# Patient Record
Sex: Male | Born: 1990 | Race: Black or African American | Hispanic: No | Marital: Single | Smoking: Never smoker
Health system: Southern US, Community
[De-identification: ages and names within clinical notes are randomized; demographics above are authoritative.]

## PROBLEM LIST (undated history)

## (undated) DIAGNOSIS — E119 Type 2 diabetes mellitus without complications: Secondary | ICD-10-CM

## (undated) HISTORY — PX: CIRCUMCISION: SUR203

## (undated) HISTORY — PX: TONSILLECTOMY: SUR1361

---

## 2009-07-13 ENCOUNTER — Emergency Department (HOSPITAL_BASED_OUTPATIENT_CLINIC_OR_DEPARTMENT_OTHER): Admission: EM | Admit: 2009-07-13 | Discharge: 2009-07-13 | Payer: Self-pay | Admitting: Emergency Medicine

## 2010-01-07 ENCOUNTER — Emergency Department (HOSPITAL_BASED_OUTPATIENT_CLINIC_OR_DEPARTMENT_OTHER): Admission: EM | Admit: 2010-01-07 | Discharge: 2010-01-07 | Payer: Self-pay | Admitting: Emergency Medicine

## 2011-05-23 ENCOUNTER — Emergency Department (HOSPITAL_BASED_OUTPATIENT_CLINIC_OR_DEPARTMENT_OTHER)
Admission: EM | Admit: 2011-05-23 | Discharge: 2011-05-23 | Disposition: A | Discharge status: Other Acute Inpatient Hospital | Payer: Self-pay | Attending: Emergency Medicine | Admitting: Emergency Medicine

## 2011-05-23 ENCOUNTER — Encounter: Payer: Self-pay | Admitting: Emergency Medicine

## 2011-05-23 DIAGNOSIS — K625 Hemorrhage of anus and rectum: Secondary | ICD-10-CM | POA: Insufficient documentation

## 2011-05-23 DIAGNOSIS — R197 Diarrhea, unspecified: Secondary | ICD-10-CM | POA: Insufficient documentation

## 2011-05-23 NOTE — ED Provider Notes (Signed)
Scribed for Andrew Chick, MD, the patient was seen in room MH11/MH11 . This chart was scribed by Ellie Lunch. This patient's care was started at 6:18 PM.   CSN: [id number] Arrival date & time: 05/23/2011  5:56 PM   First MD Initiated Contact with Patient 05/23/11 1810      Chief Complaint  Patient presents with  . Diarrhea  . Rectal Bleeding    (Consider location/radiation/quality/duration/timing/severity/associated sxs/prior treatment) HPI Andrew Richards is a 20 y.o. male who presents to the Emergency Department complaining of diarrhea for the past 3 days. Pt c/o 4-5 episodes of liquid diarrhea for the past 3 days with some associated abdominal cramping prior to episodes. Reports a small amount of bright red blood last night and this morning on toilet paper twhen he wiped. No clots present. Denies any current abdominal pain and reports only 3 episodes of diarrhea today.  Denies fever and vomiting. Denies any sick contact with stomach illness. No recent foreign travel. Says he is staying well hydrated.  Mild lower abdominal cramping resolves after diarrhea.  No fever   No past medical history on file.  Past Surgical History  Procedure Date  . Tonsillectomy     No family history on file.  History  Substance Use Topics  . Smoking status: Never Smoker   . Smokeless tobacco: Not on file  . Alcohol Use: No    Review of Systems 10 Systems reviewed and are negative for acute change except as noted in the HPI.  Allergies  Review of patient's allergies indicates no known allergies.  Home Medications   Current Outpatient Rx  Name Route Sig Dispense Refill  . ACETAMINOPHEN 325 MG PO TABS Oral Take 650 mg by mouth every 6 (six) hours as needed. For headache and pain       BP 150/74  Pulse 92  Temp(Src) 99.1 F (37.3 C) (Oral)  Resp 16  Ht 5\' 10"  (1.778 m)  Wt 380 lb 3 oz (172.452 kg)  BMI 54.55 kg/m2  SpO2 99%  Physical Exam  Nursing note and vitals  reviewed. Constitutional: He is oriented to person, place, and time. He appears well-developed and well-nourished.  HENT:  Head: Normocephalic and atraumatic.  Mouth/Throat: Oropharynx is clear and moist and mucous membranes are normal.  Eyes: Conjunctivae and EOM are normal.  Neck: Normal range of motion. Neck supple.  Cardiovascular: Normal rate, regular rhythm and normal heart sounds.   Pulmonary/Chest: Effort normal and breath sounds normal.  Abdominal: Soft. Bowel sounds are normal. There is no tenderness.  Genitourinary: Rectum normal.       No gross blood on digital rectal exam. Some extremal rectal irritation. No fissures or external hemorrhoids present.   Musculoskeletal: Normal range of motion. He exhibits no tenderness.  Neurological: He is alert and oriented to person, place, and time.  Skin: Skin is warm and dry.  Psychiatric: He has a normal mood and affect.   Procedures (including critical care time)  OTHER DATA REVIEWED: Nursing notes, vital signs, and past medical records reviewed.   DIAGNOSTIC STUDIES: Oxygen Saturation is 99% on room air, normal by my interpretation.    LABS / RADIOLOGY:  Results for orders placed during the hospital encounter of 05/23/11  OCCULT BLOOD X 1 CARD TO LAB, STOOL      Component Value Range   Fecal Occult Bld NEGATIVE     ED COURSE / COORDINATION OF CARE: 6:39 PM EDP discussed with pt blood usually present from irritation. Discussed  likely viral infection and plan to do hemoccult and stool culture.   MDM: pt with occcasional loose stools/diarrhea over the past 3 days, no fever or vomiting.  Saw blood on toilet tissue but no blood clots or gross blood in toilet.  Rectal revealed mild perirectal skin irritation, hemocult negative.  Pt concerned due to working with food.  Not able to give stool culture in the Ed, will send home to obtain specimen.  Pt nontoxic and well hydrated appearing.  Discharged with strict return precautions.  Pt  agreeable with plan.   SCRIBE ATTESTATION: I personally performed the services described in this documentation, which was scribed in my presence. The recorded information has been reviewed and considered.         Andrew Chick, MD 05/23/11 [phone]

## 2011-05-23 NOTE — ED Notes (Signed)
Diarrhea x 3 days.  No known fever.  Eating habits have changed.  Abdominal cramping.

## 2013-05-14 ENCOUNTER — Emergency Department (HOSPITAL_BASED_OUTPATIENT_CLINIC_OR_DEPARTMENT_OTHER)
Admission: EM | Admit: 2013-05-14 | Discharge: 2013-05-14 | Disposition: A | Discharge status: Home / Self Care | Payer: Self-pay | Attending: Emergency Medicine | Admitting: Emergency Medicine

## 2013-05-14 ENCOUNTER — Encounter (HOSPITAL_BASED_OUTPATIENT_CLINIC_OR_DEPARTMENT_OTHER): Payer: Self-pay | Admitting: Emergency Medicine

## 2013-05-14 DIAGNOSIS — M79671 Pain in right foot: Secondary | ICD-10-CM

## 2013-05-14 DIAGNOSIS — M25579 Pain in unspecified ankle and joints of unspecified foot: Secondary | ICD-10-CM | POA: Insufficient documentation

## 2013-05-14 DIAGNOSIS — J45909 Unspecified asthma, uncomplicated: Secondary | ICD-10-CM | POA: Insufficient documentation

## 2013-05-14 MED ORDER — IBUPROFEN 800 MG PO TABS: 800.0000 mg | ORAL_TABLET | Freq: Three times a day (TID) | ORAL | Status: DC

## 2013-05-14 NOTE — ED Provider Notes (Signed)
CSN: [id number]     Arrival date & time 05/14/13  1930 History   First MD Initiated Contact with Patient 05/14/13 1956     Chief Complaint  Patient presents with  . Ankle Pain  . Knee Pain   (Consider location/radiation/quality/duration/timing/severity/associated sxs/prior Treatment) Patient is a 22 y.o. male presenting with ankle pain and knee pain. The history is provided by the patient. No language interpreter was used.  Ankle Pain Location:  Ankle Ankle location:  R ankle Associated symptoms: no fever   Associated symptoms comment:  Worsening more persistent pain over the last several weeks to right ankle without direct injury. He works on his feet all day and reports the pain is worse throughout the day.  Knee Pain Associated symptoms: no fever     Past Medical History  Diagnosis Date  . Asthma    Past Surgical History  Procedure Laterality Date  . Tonsillectomy    . Circumcision     History reviewed. No pertinent family history. History  Substance Use Topics  . Smoking status: Never Smoker   . Smokeless tobacco: Not on file  . Alcohol Use: No    Review of Systems  Constitutional: Negative for fever and chills.  Respiratory: Negative for shortness of breath.   Musculoskeletal: Negative.        See HPI  Skin: Negative.   Neurological: Negative.  Negative for numbness.    Allergies  Shellfish allergy  Home Medications   Current Outpatient Rx  Name  Route  Sig  Dispense  Refill  . ibuprofen (ADVIL,MOTRIN) 600 MG tablet   Oral   Take 600 mg by mouth every 6 (six) hours as needed for pain.         Marland Kitchen acetaminophen (TYLENOL) 325 MG tablet   Oral   Take 650 mg by mouth every 6 (six) hours as needed. For headache and pain           BP 160/99  Pulse 79  Temp(Src) 98.5 F (36.9 C) (Oral)  Resp 20  Ht 5\' 10"  (1.778 m)  SpO2 100% Physical Exam  Constitutional: He is oriented to person, place, and time. He appears well-developed and well-nourished.   Neck: Normal range of motion.  Pulmonary/Chest: Effort normal.  Musculoskeletal: Normal range of motion.  Right ankle unremarkable in appearance, no swelling or discoloration. Joint stable. Mild to moderate tenderness to anterolateral ankle into proximal foot. FROM, full strength.   Neurological: He is alert and oriented to person, place, and time.  Skin: Skin is warm and dry.  Psychiatric: He has a normal mood and affect.    ED Course  Procedures (including critical care time) Labs Review Labs Reviewed - No data to display Imaging Review No results found.  EKG Interpretation   None       MDM  No diagnosis found. 1. Foot pain  Uncomplicated foot pain, persistent.     Arnoldo Hooker, PA-C 05/14/13 2046

## 2013-05-14 NOTE — ED Notes (Signed)
C/o right ankle pain and left knee pain for few weeks, denies injury. Pt is able to ambulate. Denies CP or SOB.

## 2013-05-14 NOTE — ED Provider Notes (Signed)
Medical screening examination/treatment/procedure(s) were performed by non-physician practitioner and as supervising physician I was immediately available for consultation/collaboration.     Christopher J. Pollina, MD 05/14/13 2236 

## 2013-12-31 ENCOUNTER — Encounter (HOSPITAL_BASED_OUTPATIENT_CLINIC_OR_DEPARTMENT_OTHER): Payer: Self-pay | Admitting: Emergency Medicine

## 2013-12-31 ENCOUNTER — Inpatient Hospital Stay (HOSPITAL_BASED_OUTPATIENT_CLINIC_OR_DEPARTMENT_OTHER)
Admission: EM | Admit: 2013-12-31 | Discharge: 2014-01-02 | DRG: 638 | Disposition: A | Discharge status: Home / Self Care | Payer: Self-pay | Attending: Internal Medicine | Admitting: Internal Medicine

## 2013-12-31 DIAGNOSIS — Z833 Family history of diabetes mellitus: Secondary | ICD-10-CM

## 2013-12-31 DIAGNOSIS — R739 Hyperglycemia, unspecified: Secondary | ICD-10-CM | POA: Diagnosis present

## 2013-12-31 DIAGNOSIS — Z6841 Body Mass Index (BMI) 40.0 and over, adult: Secondary | ICD-10-CM

## 2013-12-31 DIAGNOSIS — R030 Elevated blood-pressure reading, without diagnosis of hypertension: Secondary | ICD-10-CM | POA: Diagnosis present

## 2013-12-31 DIAGNOSIS — E1310 Other specified diabetes mellitus with ketoacidosis without coma: Principal | ICD-10-CM | POA: Diagnosis present

## 2013-12-31 DIAGNOSIS — Z8249 Family history of ischemic heart disease and other diseases of the circulatory system: Secondary | ICD-10-CM

## 2013-12-31 DIAGNOSIS — Z91013 Allergy to seafood: Secondary | ICD-10-CM

## 2013-12-31 DIAGNOSIS — R109 Unspecified abdominal pain: Secondary | ICD-10-CM | POA: Diagnosis present

## 2013-12-31 DIAGNOSIS — J45909 Unspecified asthma, uncomplicated: Secondary | ICD-10-CM | POA: Diagnosis present

## 2013-12-31 DIAGNOSIS — E119 Type 2 diabetes mellitus without complications: Secondary | ICD-10-CM | POA: Diagnosis present

## 2013-12-31 LAB — CBG MONITORING, ED: Glucose-Capillary: 464 mg/dL — ABNORMAL HIGH (ref 70–99)

## 2013-12-31 MED ORDER — SODIUM CHLORIDE 0.9 % IV BOLUS (SEPSIS)
1000.0000 mL | Freq: Once | INTRAVENOUS | Status: AC
  Administered 2014-01-01: 1000 mL via INTRAVENOUS

## 2013-12-31 MED ORDER — SODIUM CHLORIDE 0.9 % IV SOLN
INTRAVENOUS | Status: DC
  Administered 2014-01-01: 4 [IU]/h via INTRAVENOUS

## 2013-12-31 NOTE — ED Provider Notes (Signed)
CSN: [id number]     Arrival date & time 12/31/13  2302 History  This chart was scribed for Hanley Seamen, MD by Modena Jansky, ED Scribe. This patient was seen in room MH07/MH07 and the patient's care was started at 11:47 PM.  Chief Complaint  Patient presents with  . Dysuria    The history is provided by the patient. No language interpreter was used.   HPI Comments: Andrew Richards is a 23 y.o. male who presents to the Emergency Department complaining of increased urinary frequency and urgency for the past 3 days. He also reports associated left flank discomfort, dry mouth, polydipsia, and chills over the past 3 days. It is severe enough that he is having to urinate about once every 1.5 hours. There are no specific exacerbating or mitigating factors. His CBG in the ED was noted to be 464. He denies blurred vision, fever, nausea, vomiting, diarrhea, cough, chest pain or SOB. He states that he does not have a PCP.  Past Medical History  Diagnosis Date  . Asthma    Past Surgical History  Procedure Laterality Date  . Tonsillectomy    . Circumcision     History reviewed. No pertinent family history. History  Substance Use Topics  . Smoking status: Never Smoker   . Smokeless tobacco: Not on file  . Alcohol Use: No    Review of Systems A complete 10 system review of systems was obtained and all systems are negative except as noted in the HPI and PMH.   Allergies  Shellfish allergy  Home Medications   Prior to Admission medications   Medication Sig Start Date End Date Taking? Authorizing Provider  acetaminophen (TYLENOL) 325 MG tablet Take 650 mg by mouth every 6 (six) hours as needed. For headache and pain     Historical Provider, MD  ibuprofen (ADVIL,MOTRIN) 600 MG tablet Take 600 mg by mouth every 6 (six) hours as needed for pain.    Historical Provider, MD  ibuprofen (ADVIL,MOTRIN) 800 MG tablet Take 1 tablet (800 mg total) by mouth 3 (three) times daily. 05/14/13   Arnoldo Hooker, PA-C   Triage Vitals: BP 151/94  Pulse 96  Temp(Src) 98.8 F (37.1 C) (Oral)  Resp 18  SpO2 98%  Physical Exam General: Well-developed, morbidly obese male in no acute distress; appearance consistent with age of record HENT: normocephalic; atraumatic, mucous membranes dry Eyes: pupils equal, round and reactive to light; extraocular muscles intact Neck: supple Heart: regular rate and rhythm; no murmurs, rubs or gallops Lungs: clear to auscultation bilaterally Abdomen: soft; nondistended; nontender; no masses or hepatosplenomegaly; bowel sounds present GU: some equivocal left CVA tenderness Extremities: No deformity; full range of motion; pulses normal Neurologic: Awake, alert and oriented; motor function intact in all extremities and symmetric; no facial droop Skin: Warm and dry Psychiatric: Normal mood and affect  ED Course  Procedures (including critical care time)  DIAGNOSTIC STUDIES: Oxygen Saturation is 98% on RA, normal by my interpretation.    COORDINATION OF CARE: 11:52 PM- Pt advised of plan for treatment which includes medications, and labs and pt agrees.   MDM   Nursing notes and vitals signs, including pulse oximetry, reviewed.  Summary of this visit's results, reviewed by myself:  Labs:  Results for orders placed during the hospital encounter of 12/31/13 (from the past 24 hour(s))  CBG MONITORING, ED     Status: Abnormal   Collection Time    12/31/13 11:38 PM  Result Value Ref Range   Glucose-Capillary 464 (*) 70 - 99 mg/dL  URINALYSIS, ROUTINE W REFLEX MICROSCOPIC     Status: Abnormal   Collection Time    12/31/13 11:51 PM      Result Value Ref Range   Color, Urine COLORLESS (*) YELLOW   APPearance CLEAR  CLEAR   Specific Gravity, Urine 1.029  1.005 - 1.030   pH 6.0  5.0 - 8.0   Glucose, UA >1000 (*) NEGATIVE mg/dL   Hgb urine dipstick NEGATIVE  NEGATIVE   Bilirubin Urine NEGATIVE  NEGATIVE   Ketones, ur NEGATIVE  NEGATIVE mg/dL    Protein, ur NEGATIVE  NEGATIVE mg/dL   Urobilinogen, UA 1.0  0.0 - 1.0 mg/dL   Nitrite NEGATIVE  NEGATIVE   Leukocytes, UA NEGATIVE  NEGATIVE  URINE MICROSCOPIC-ADD ON     Status: None   Collection Time    12/31/13 11:51 PM      Result Value Ref Range   WBC, UA    <3 WBC/hpf   Value: NO FORMED ELEMENTS SEEN ON URINE MICROSCOPIC EXAMINATION   RBC / HPF    <3 RBC/hpf   Value: NO FORMED ELEMENTS SEEN ON URINE MICROSCOPIC EXAMINATION  CBC WITH DIFFERENTIAL     Status: None   Collection Time    01/01/14 12:23 AM      Result Value Ref Range   WBC 7.1  4.0 - 10.5 K/uL   RBC 5.56  4.22 - 5.81 MIL/uL   Hemoglobin 15.8  13.0 - 17.0 g/dL   HCT 16.145.6  09.639.0 - 04.552.0 %   MCV 82.0  78.0 - 100.0 fL   MCH 28.4  26.0 - 34.0 pg   MCHC 34.6  30.0 - 36.0 g/dL   RDW 40.912.3  81.111.5 - 91.415.5 %   Platelets 262  150 - 400 K/uL   Neutrophils Relative % 48  43 - 77 %   Neutro Abs 3.4  1.7 - 7.7 K/uL   Lymphocytes Relative 37  12 - 46 %   Lymphs Abs 2.7  0.7 - 4.0 K/uL   Monocytes Relative 12  3 - 12 %   Monocytes Absolute 0.9  0.1 - 1.0 K/uL   Eosinophils Relative 1  0 - 5 %   Eosinophils Absolute 0.1  0.0 - 0.7 K/uL   Basophils Relative 0  0 - 1 %   Basophils Absolute 0.0  0.0 - 0.1 K/uL  BASIC METABOLIC PANEL     Status: Abnormal   Collection Time    01/01/14 12:23 AM      Result Value Ref Range   Sodium 132 (*) 137 - 147 mEq/L   Potassium 4.0  3.7 - 5.3 mEq/L   Chloride 91 (*) 96 - 112 mEq/L   CO2 25  19 - 32 mEq/L   Glucose, Bld 516 (*) 70 - 99 mg/dL   BUN 12  6 - 23 mg/dL   Creatinine, Ser 7.820.80  0.50 - 1.35 mg/dL   Calcium 95.610.3  8.4 - 21.310.5 mg/dL   GFR calc non Af Amer >90  >90 mL/min   GFR calc Af Amer >90  >90 mL/min  I-STAT VENOUS BLOOD GAS, ED     Status: Abnormal   Collection Time    01/01/14 12:31 AM      Result Value Ref Range   pH, Ven 7.406 (*) 7.250 - 7.300   pCO2, Ven 44.0 (*) 45.0 - 50.0 mmHg   pO2, Ven 34.0  30.0 -  45.0 mmHg   Bicarbonate 27.7 (*) 20.0 - 24.0 mEq/L   TCO2 29  0 -  100 mmol/L   O2 Saturation 66.0     Acid-Base Excess 2.0  0.0 - 2.0 mmol/L   Patient temperature 98.4 F     Collection site IV START     Drawn by Nurse     Sample type VENOUS     Comment NOTIFIED PHYSICIAN    CBG MONITORING, ED     Status: Abnormal   Collection Time    01/01/14  1:58 AM      Result Value Ref Range   Glucose-Capillary 366 (*) 70 - 99 mg/dL   Corrected sodium is 856; anion gap is 23.  2:09 AM Patient's sugar is only come down to 366 despite IV hydration and insulin infusion per Glucose Stabilizer.   I personally performed the services described in this documentation, which was scribed in my presence. The recorded information has been reviewed and is accurate.   Hanley Seamen, MD 01/01/14 [phone]

## 2013-12-31 NOTE — ED Notes (Signed)
464 CBG - EDP notified

## 2013-12-31 NOTE — ED Notes (Signed)
Pt reports onset freq urination since 12/28/13 but over last 2 days note flank pain and chills

## 2014-01-01 ENCOUNTER — Observation Stay (HOSPITAL_COMMUNITY): Payer: Self-pay

## 2014-01-01 ENCOUNTER — Encounter (HOSPITAL_COMMUNITY): Payer: Self-pay | Admitting: Internal Medicine

## 2014-01-01 DIAGNOSIS — E119 Type 2 diabetes mellitus without complications: Secondary | ICD-10-CM | POA: Diagnosis present

## 2014-01-01 DIAGNOSIS — R109 Unspecified abdominal pain: Secondary | ICD-10-CM | POA: Diagnosis present

## 2014-01-01 DIAGNOSIS — R739 Hyperglycemia, unspecified: Secondary | ICD-10-CM | POA: Diagnosis present

## 2014-01-01 DIAGNOSIS — R7309 Other abnormal glucose: Secondary | ICD-10-CM

## 2014-01-01 LAB — BASIC METABOLIC PANEL
BUN: 12 mg/dL (ref 6–23)
BUN: 9 mg/dL (ref 6–23)
CALCIUM: 10.3 mg/dL (ref 8.4–10.5)
CHLORIDE: 101 meq/L (ref 96–112)
CO2: 24 meq/L (ref 19–32)
CO2: 25 meq/L (ref 19–32)
CREATININE: 0.8 mg/dL (ref 0.50–1.35)
Calcium: 9 mg/dL (ref 8.4–10.5)
Chloride: 91 mEq/L — ABNORMAL LOW (ref 96–112)
Creatinine, Ser: 0.67 mg/dL (ref 0.50–1.35)
GFR calc Af Amer: >90 mL/min (ref >90)
GFR calc Af Amer: >90 mL/min (ref >90)
GFR calc non Af Amer: >90 mL/min (ref >90)
Glucose, Bld: 516 mg/dL — ABNORMAL HIGH (ref 70–99)
Glucose, Bld: 259 mg/dL — ABNORMAL HIGH (ref 70–99)
Potassium: 4 mEq/L (ref 3.7–5.3)
Potassium: 3.6 mEq/L — ABNORMAL LOW (ref 3.7–5.3)
SODIUM: 137 meq/L (ref 137–147)
Sodium: 132 mEq/L — ABNORMAL LOW (ref 137–147)

## 2014-01-01 LAB — I-STAT VENOUS BLOOD GAS, ED
ACID-BASE EXCESS: 2 mmol/L (ref 0.0–2.0)
BICARBONATE: 27.7 meq/L — AB (ref 20.0–24.0)
O2 SAT: 66 %
PO2 VEN: 34 mmHg (ref 30.0–45.0)
Patient temperature: 98.4
TCO2: 29 mmol/L (ref 0–100)
pCO2, Ven: 44 mmHg — ABNORMAL LOW (ref 45.0–50.0)
pH, Ven: 7.406 — ABNORMAL HIGH (ref 7.250–7.300)

## 2014-01-01 LAB — URINALYSIS, ROUTINE W REFLEX MICROSCOPIC
BILIRUBIN URINE: NEGATIVE
Glucose, UA: >1000 mg/dL — AB
HGB URINE DIPSTICK: NEGATIVE
KETONES UR: NEGATIVE mg/dL
Leukocytes, UA: NEGATIVE
NITRITE: NEGATIVE
PROTEIN: NEGATIVE mg/dL
Specific Gravity, Urine: 1.029 (ref 1.005–1.030)
UROBILINOGEN UA: 1 mg/dL (ref 0.0–1.0)
pH: 6 (ref 5.0–8.0)

## 2014-01-01 LAB — GLUCOSE, CAPILLARY
GLUCOSE-CAPILLARY: 242 mg/dL — AB (ref 70–99)
GLUCOSE-CAPILLARY: 257 mg/dL — AB (ref 70–99)
GLUCOSE-CAPILLARY: 265 mg/dL — AB (ref 70–99)
Glucose-Capillary: 261 mg/dL — ABNORMAL HIGH (ref 70–99)
Glucose-Capillary: 251 mg/dL — ABNORMAL HIGH (ref 70–99)
Glucose-Capillary: 226 mg/dL — ABNORMAL HIGH (ref 70–99)
Glucose-Capillary: 232 mg/dL — ABNORMAL HIGH (ref 70–99)
Glucose-Capillary: 203 mg/dL — ABNORMAL HIGH (ref 70–99)
Glucose-Capillary: 289 mg/dL — ABNORMAL HIGH (ref 70–99)

## 2014-01-01 LAB — URINE MICROSCOPIC-ADD ON
RBC / HPF: NONE SEEN RBC/hpf (ref <3)
WBC UA: NONE SEEN WBC/hpf (ref <3)

## 2014-01-01 LAB — LIPID PANEL
Cholesterol: 177 mg/dL (ref 0–200)
HDL: 23 mg/dL — AB (ref >39)
LDL CALC: 102 mg/dL — AB (ref 0–99)
TRIGLYCERIDES: 258 mg/dL — AB (ref <150)
Total CHOL/HDL Ratio: 7.7 RATIO
VLDL: 52 mg/dL — ABNORMAL HIGH (ref 0–40)

## 2014-01-01 LAB — CBC WITH DIFFERENTIAL/PLATELET
BASOS ABS: 0 10*3/uL (ref 0.0–0.1)
Basophils Relative: 0 % (ref 0–1)
EOS ABS: 0.1 10*3/uL (ref 0.0–0.7)
EOS PCT: 1 % (ref 0–5)
HEMATOCRIT: 45.6 % (ref 39.0–52.0)
Hemoglobin: 15.8 g/dL (ref 13.0–17.0)
LYMPHS PCT: 37 % (ref 12–46)
Lymphs Abs: 2.7 10*3/uL (ref 0.7–4.0)
MCH: 28.4 pg (ref 26.0–34.0)
MCHC: 34.6 g/dL (ref 30.0–36.0)
MCV: 82 fL (ref 78.0–100.0)
MONO ABS: 0.9 10*3/uL (ref 0.1–1.0)
Monocytes Relative: 12 % (ref 3–12)
Neutro Abs: 3.4 10*3/uL (ref 1.7–7.7)
Neutrophils Relative %: 48 % (ref 43–77)
Platelets: 262 10*3/uL (ref 150–400)
RBC: 5.56 MIL/uL (ref 4.22–5.81)
RDW: 12.3 % (ref 11.5–15.5)
WBC: 7.1 10*3/uL (ref 4.0–10.5)

## 2014-01-01 LAB — CBC
HCT: 41.2 % (ref 39.0–52.0)
Hemoglobin: 13.6 g/dL (ref 13.0–17.0)
MCH: 27.6 pg (ref 26.0–34.0)
MCHC: 33 g/dL (ref 30.0–36.0)
MCV: 83.7 fL (ref 78.0–100.0)
PLATELETS: 215 10*3/uL (ref 150–400)
RBC: 4.92 MIL/uL (ref 4.22–5.81)
RDW: 12.6 % (ref 11.5–15.5)
WBC: 5.9 10*3/uL (ref 4.0–10.5)

## 2014-01-01 LAB — HEMOGLOBIN A1C
Hgb A1c MFr Bld: 12.5 % — ABNORMAL HIGH (ref <5.7)
MEAN PLASMA GLUCOSE: 312 mg/dL — AB (ref <117)

## 2014-01-01 LAB — LIPASE, BLOOD: Lipase: 27 U/L (ref 11–59)

## 2014-01-01 LAB — HEPATIC FUNCTION PANEL
ALBUMIN: 3.3 g/dL — AB (ref 3.5–5.2)
ALK PHOS: 140 U/L — AB (ref 39–117)
ALT: 40 U/L (ref 0–53)
AST: 21 U/L (ref 0–37)
Bilirubin, Direct: <0.2 mg/dL (ref 0.0–0.3)
Total Bilirubin: 0.3 mg/dL (ref 0.3–1.2)
Total Protein: 6.8 g/dL (ref 6.0–8.3)

## 2014-01-01 LAB — CBG MONITORING, ED
GLUCOSE-CAPILLARY: 285 mg/dL — AB (ref 70–99)
Glucose-Capillary: 366 mg/dL — ABNORMAL HIGH (ref 70–99)

## 2014-01-01 LAB — TSH: TSH: 1.98 u[IU]/mL (ref 0.350–4.500)

## 2014-01-01 MED ORDER — LIVING WELL WITH DIABETES BOOK
Freq: Once | Status: AC
  Administered 2014-01-01: 14:00:00
  Filled 2014-01-01: qty 1

## 2014-01-01 MED ORDER — SODIUM CHLORIDE 0.9 % IV SOLN
INTRAVENOUS | Status: DC
  Administered 2014-01-01: 6.6 [IU]/h via INTRAVENOUS
  Filled 2014-01-01: qty 1

## 2014-01-01 MED ORDER — ENOXAPARIN SODIUM 40 MG/0.4ML ~~LOC~~ SOLN
40.0000 mg | Freq: Every day | SUBCUTANEOUS | Status: DC
  Administered 2014-01-01: 40 mg via SUBCUTANEOUS
  Filled 2014-01-01: qty 0.4

## 2014-01-01 MED ORDER — INSULIN REGULAR HUMAN 100 UNIT/ML IJ SOLN: INTRAMUSCULAR | Status: DC

## 2014-01-01 MED ORDER — SODIUM CHLORIDE 0.9 % IV SOLN
INTRAVENOUS | Status: AC
  Administered 2014-01-01: 05:00:00 via INTRAVENOUS

## 2014-01-01 MED ORDER — INSULIN GLARGINE 100 UNIT/ML ~~LOC~~ SOLN
10.0000 [IU] | Freq: Every day | SUBCUTANEOUS | Status: DC
  Administered 2014-01-01: 10 [IU] via SUBCUTANEOUS
  Filled 2014-01-01 (×2): qty 0.1

## 2014-01-01 MED ORDER — ENOXAPARIN SODIUM 80 MG/0.8ML ~~LOC~~ SOLN
80.0000 mg | Freq: Every day | SUBCUTANEOUS | Status: DC
  Administered 2014-01-02: 80 mg via SUBCUTANEOUS
  Filled 2014-01-01: qty 0.8

## 2014-01-01 MED ORDER — INSULIN ASPART 100 UNIT/ML ~~LOC~~ SOLN
0.0000 [IU] | Freq: Three times a day (TID) | SUBCUTANEOUS | Status: DC
  Administered 2014-01-01 – 2014-01-02 (×3): 5 [IU] via SUBCUTANEOUS

## 2014-01-01 MED ORDER — INSULIN ASPART 100 UNIT/ML ~~LOC~~ SOLN
0.0000 [IU] | Freq: Every day | SUBCUTANEOUS | Status: DC
  Administered 2014-01-01: 3 [IU] via SUBCUTANEOUS

## 2014-01-01 MED ORDER — ONDANSETRON HCL 4 MG PO TABS: 4.0000 mg | ORAL_TABLET | Freq: Four times a day (QID) | ORAL | Status: DC | PRN

## 2014-01-01 MED ORDER — ONDANSETRON HCL 4 MG/2ML IJ SOLN: 4.0000 mg | Freq: Four times a day (QID) | INTRAMUSCULAR | Status: DC | PRN

## 2014-01-01 MED ORDER — DEXTROSE-NACL 5-0.45 % IV SOLN
INTRAVENOUS | Status: DC
  Administered 2014-01-01: 06:00:00 via INTRAVENOUS

## 2014-01-01 MED ORDER — ACETAMINOPHEN 325 MG PO TABS: 650.0000 mg | ORAL_TABLET | Freq: Four times a day (QID) | ORAL | Status: DC | PRN

## 2014-01-01 MED ORDER — SODIUM CHLORIDE 0.9 % IV SOLN
1000.0000 mL | INTRAVENOUS | Status: DC
  Administered 2014-01-02: 1000 mL via INTRAVENOUS

## 2014-01-01 MED ORDER — INSULIN REGULAR HUMAN 100 UNIT/ML IJ SOLN
INTRAMUSCULAR | Status: AC
  Filled 2014-01-01: qty 1

## 2014-01-01 MED ORDER — SODIUM CHLORIDE 0.9 % IV BOLUS (SEPSIS)
1000.0000 mL | Freq: Once | INTRAVENOUS | Status: AC
  Administered 2014-01-01: 1000 mL via INTRAVENOUS

## 2014-01-01 MED ORDER — ACETAMINOPHEN 650 MG RE SUPP: 650.0000 mg | Freq: Four times a day (QID) | RECTAL | Status: DC | PRN

## 2014-01-01 NOTE — Progress Notes (Signed)
Followup note:  Patient admitted earlier this morning. Seen after arrival to floor. CBGs continue to improve.  Diabetes mellitus2 with mild DKA: Resolved. A1c at 12.5. Likely will need home Lantus. Weaned off of insulin drip. Patient knowledgeable, however benefits do not start for another 3 months. We'll work with case management. Patient likely would benefit greatly from Lantus. Appreciate diabetes coordinator help.  Morbid obesity: Patient needs criteria with BMI of 47.

## 2014-01-01 NOTE — Progress Notes (Signed)
Nutrition Education Note  RD consulted for nutrition education regarding diabetes.   RD provided "Carbohydrate Counting for People with Diabetes" handout from the Academy of Nutrition and Dietetics. Discussed different food groups and their effects on blood sugar, emphasizing carbohydrate-containing foods. Provided list of carbohydrates and recommended serving sizes of common foods.  Discussed importance of controlled and consistent carbohydrate intake throughout the day. Provided examples of ways to balance meals/snacks and encouraged intake of high-fiber, whole grain complex carbohydrates. Teach back method used.  Pt admits to drinking sweet tea and an occasional soda and having portion sizes that can be excessive such as eating 1/2 of an entire pizza. Pt states he sometimes skips meals. Emphasized importance of regular consistent meal intake and diabetic friendly beverages and portion sizes. Pt's roommate's father is a diabetic and does the grocery shopping and meal planning and states that prior to pt finding out he was diabetic, he was planning on getting him and his roommate to eat healthier.   Expect good compliance.  Body mass index is 47.63 kg/(m^2). Pt meets criteria for morbid obesity based on current BMI.  Current diet order is CHO modified, patient is consuming approximately 40% of meals at this time. Labs and medications reviewed. Potassium low, recommend MD replacement. Alk phos elevated. No further nutrition interventions warranted at this time. RD contact information provided. If additional nutrition issues arise, please re-consult RD.  Mikey College MS, [place], [place] Pager [phone] Weekend/After Hours Pager

## 2014-01-01 NOTE — Progress Notes (Signed)
Inpatient Diabetes Program Recommendations  AACE/ADA: New Consensus Statement on Inpatient Glycemic Control (2013)  Target Ranges:  Prepandial:   less than 140 mg/dL      Peak postprandial:   less than 180 mg/dL (1-2 hours)      Critically ill patients:  140 - 180 mg/dL     Results for Andrew Richards, Andrew Richards (MRN [id number]) as of 01/01/2014 15:58  Ref. Range 01/01/2014 00:23 01/01/2014 06:36  Glucose Latest Range: 70-99 mg/dL 435 (H) 686 (H)    **Admitted with New diagnosis of DM.  Glucose 516 mg/dl on admission.    **Patient does not have PCP.  No health insurance at present.  Per patient, he will start a new job in a week and will have insurance coverage as of 04/14/14 through his new job.  Spoke with care management and alerted them to this.  Care management to see patient and give patient information on low cost clinics for medical follow up after d/c until patient's health insurance plan starts.  **Waiting for A1c results to help determine plan for d/c medications.  **Noted patient transitioned off IV insulin drip today to SQ Lantus and Novolog.   **Spoke with pt about new diagnosis.  Explained what an A1C is, basic pathophysiology of DM Type 2, basic home care, importance of checking CBGs and maintaining good CBG control to prevent long-term and short-term complications.  Reviewed signs and symptoms of hyperglycemia and hypoglycemia.  RNs to provide ongoing basic DM education at bedside with this patient.  Have ordered educational booklet and RD consult for DM diet education.  Patient told me his roommate's father has DM and that he is interested in learning how to eat better.  Encouraged patient to avoid sweetened beverages and to be aware of his portion sizes of carbohydrate containing foods.  Noted RD has already spoken with patient today.  Hopeful that patient can follow up with one of our low cost clinics until his insurance kicks in.  Encouraged patient to get established with a PCP in the  area for regular medical follow up.  Also encouraged patient to purchase a CBG meter OTC at either [employer] or [employer].  Patient stated his roommate's dad had a meter, however, I strongly encouraged patient to get his own meter.  Encouraged patient to check his CBGs at least bid at home and to record all CBGs for his PCP to review.   Will follow Ambrose Finland RN, MSN, CDE Diabetes Coordinator Inpatient Diabetes Program Team Pager: [phone] (8a-10p)

## 2014-01-01 NOTE — Progress Notes (Signed)
UR completed. Patient changed to inpatient- requiring IV insulin gtt 

## 2014-01-01 NOTE — H&P (Signed)
[facility] History and Physical  Andrew BoringJoshua Torian ZOX:[id number] DOB: 10-30-1990 DOA: 12/31/2013  Referring physician: [facility] PCP: No PCP Per Patient  Chief Complaint: Increased urination and left flank pain.  HPI: Andrew Richards is a 23 y.o. male with past medical history of bronchial asthma has been experiencing increasing frequency of urination and left flank pain over the last 3 days. Denies any fever chills nausea vomiting or diarrhea. In the ER patient was found to have elevated blood sugar more than 500 and was started on IV insulin infusion and was admitted for further management. On my exam patient's left flank is mildly tender. Denies any trauma or fall. Denies any chest pain shortness of breath. Patient's UA is unremarkable except for large amounts of glucose. Denies any dysuria or hematuria.   Review of Systems: As presented in the history of presenting illness, rest negative.  Past Medical History  Diagnosis Date  . Asthma    Past Surgical History  Procedure Laterality Date  . Tonsillectomy    . Circumcision     Social History:  reports that he has never smoked. He does not have any smokeless tobacco history on file. He reports that he does not drink alcohol. His drug history is not on file. Where does patient live home. Can patient participate in ADLs? Yes.  Allergies  Allergen Reactions  . Shellfish Allergy     Family History:  Family History  Problem Relation Age of Onset  . CAD Father   . Diabetes Mellitus II Other       Prior to Admission medications   Medication Sig Start Date End Date Taking? Authorizing Provider  acetaminophen (TYLENOL) 325 MG tablet Take 650 mg by mouth every 6 (six) hours as needed. For headache and pain     Historical Provider, MD  ibuprofen (ADVIL,MOTRIN) 600 MG tablet Take 600 mg by mouth every 6 (six) hours as needed for pain.    Historical Provider, MD  ibuprofen (ADVIL,MOTRIN)  800 MG tablet Take 1 tablet (800 mg total) by mouth 3 (three) times daily. 05/14/13   Arnoldo HookerShari A Upstill, PA-C    Physical Exam: Filed Vitals:   01/01/14 0258 01/01/14 0259 01/01/14 0309 01/01/14 0405  BP:    143/92  Pulse: 93 89  84  Temp:    98.4 F (36.9 C)  TempSrc:    Oral  Resp:   18 18  Height:    6' (1.829 m)  Weight:    159.349 kg (351 lb 4.8 oz)  SpO2: 97% 96%  97%     General:  Morbidly obese not in distress.  Eyes: Anicteric no pallor.  ENT: No discharge from the ears eyes nose mouth.  Neck: No mass felt.  Cardiovascular: S1-S2 heard.  Respiratory: No rhonchi or crepitations.  Abdomen: Soft mild tenderness in the left flank. No guarding or rigidity.  Skin: No rash.  Musculoskeletal: No edema.  Psychiatric: Appears normal. Has had some suicidal cause 3 months ago denies any now.  Neurologic: Alert. Oriented to time place and person. Moves all extremities.  Labs on Admission:  Basic Metabolic Panel:  Recent Labs Lab 01/01/14 0023  NA 132*  K 4.0  CL 91*  CO2 25  GLUCOSE 516*  BUN 12  CREATININE 0.80  CALCIUM 10.3   Liver Function Tests: No results found for this basename: AST, ALT, ALKPHOS, BILITOT, PROT, ALBUMIN,  in the last 168 hours No results found for this basename: LIPASE, AMYLASE,  in the last 168 hours No results found for this basename: AMMONIA,  in the last 168 hours CBC:  Recent Labs Lab 01/01/14 0023  WBC 7.1  NEUTROABS 3.4  HGB 15.8  HCT 45.6  MCV 82.0  PLT 262   Cardiac Enzymes: No results found for this basename: CKTOTAL, CKMB, CKMBINDEX, TROPONINI,  in the last 168 hours  BNP (last 3 results) No results found for this basename: PROBNP,  in the last 8760 hours CBG:  Recent Labs Lab 12/31/13 2338 01/01/14 0158 01/01/14 0302 01/01/14 0402  GLUCAP 464* 366* 285* 261*    Radiological Exams on Admission: No results found.   Assessment/Plan Principal Problem:   Hyperglycemia Active Problems:   Flank  pain   1. Severe hyperglycemia - consistent with new-onset diabetes mellitus. Check hemoglobin A1c. Patient does have mildly elevated anion gap but I don't think patient is in DKA. Be metabolic panel is pending and if blood sugar is less than 250 and there is no anion gap needs to be transitioned to long-acting subcutaneous insulin. 2. Left flank pain and urinary frequency - check CT abdomen and pelvis without contrast for any renal stones or hydronephrosis. 3. Elevated blood pressure - closely follow blood pressure trends.  Patient did say that he had some suicidal thoughts 3 months ago but none today.  Patient's repeat metabolic panel, LFTs lipid panel and TSH are pending.    Code Status: Full code.  Family Communication: None.  Disposition Plan: Admit for observation.    Eduard Clos [facility] Pager [phone].  If 7PM-7AM, please contact night-coverage [email or web address] Password [facility] 01/01/2014, 4:51 AM

## 2014-01-01 NOTE — ED Notes (Signed)
I took CBG and got result of 285 mg./dcltr.

## 2014-01-02 LAB — GLUCOSE, CAPILLARY
GLUCOSE-CAPILLARY: 297 mg/dL — AB (ref 70–99)
Glucose-Capillary: 269 mg/dL — ABNORMAL HIGH (ref 70–99)

## 2014-01-02 MED ORDER — GLUCOSE BLOOD VI STRP: ORAL_STRIP | Status: DC

## 2014-01-02 MED ORDER — INSULIN STARTER KIT- PEN NEEDLES (ENGLISH)
1.0000 | Freq: Once | Status: AC
  Administered 2014-01-02: 1
  Filled 2014-01-02: qty 1

## 2014-01-02 MED ORDER — INSULIN GLARGINE 100 UNIT/ML ~~LOC~~ SOLN
14.0000 [IU] | Freq: Every day | SUBCUTANEOUS | Status: DC
  Administered 2014-01-02: 14 [IU] via SUBCUTANEOUS
  Filled 2014-01-02: qty 0.14

## 2014-01-02 MED ORDER — INSULIN GLARGINE 100 UNIT/ML SOLOSTAR PEN: 20.0000 [IU] | PEN_INJECTOR | Freq: Every day | SUBCUTANEOUS | Status: DC

## 2014-01-02 MED ORDER — INSULIN GLARGINE 100 UNIT/ML ~~LOC~~ SOLN
20.0000 [IU] | Freq: Every day | SUBCUTANEOUS | Status: DC
Start: 2014-01-02 — End: 2014-01-02

## 2014-01-02 MED ORDER — "PEN NEEDLES 3/16"" 31G X 5 MM MISC": Status: DC

## 2014-01-02 NOTE — Progress Notes (Signed)
Pt discharged to home. IV d/ced - site unremarkable. Skin intact, vitals stable, no pain. Patient received diabetic teaching from MD, diabetes educator and RN. Pt verbalized understanding and comfort with managing diabetes. I also witnessed him drawing up insulin and self administering. Prescriptions have been called in by MD to pharmacy. Discharge instructions given and all questions answered. Pt has called ride, will monitor until transported off unit via wheelchair. Brandy Kabat D Rivka Safer

## 2014-01-02 NOTE — Discharge Instructions (Signed)
Type 2 Diabetes Mellitus, Adult Type 2 diabetes mellitus, often simply referred to as type 2 diabetes, is a long-lasting (chronic) disease. In type 2 diabetes, the pancreas does not make enough insulin (a hormone), the cells are less responsive to the insulin that is made (insulin resistance), or both. Normally, insulin moves sugars from food into the tissue cells. The tissue cells use the sugars for energy. The lack of insulin or the lack of normal response to insulin causes excess sugars to build up in the blood instead of going into the tissue cells. As a result, high blood sugar (hyperglycemia) develops. The effect of high sugar (glucose) levels can cause many complications. Type 2 diabetes was also previously called adult-onset diabetes but it can occur at any age.  RISK FACTORS  A person is predisposed to developing type 2 diabetes if someone in the family has the disease and also has one or more of the following primary risk factors:  Overweight.  An inactive lifestyle.  A history of consistently eating high-calorie foods. Maintaining a normal weight and regular physical activity can reduce the chance of developing type 2 diabetes. SYMPTOMS  A person with type 2 diabetes may not show symptoms initially. The symptoms of type 2 diabetes appear slowly. The symptoms include:  Increased thirst (polydipsia).  Increased urination (polyuria).  Increased urination during the night (nocturia).  Weight loss. This weight loss may be rapid.  Frequent, recurring infections.  Tiredness (fatigue).  Weakness.  Vision changes, such as blurred vision.  Fruity smell to your breath.  Abdominal pain.  Nausea or vomiting.  Cuts or bruises which are slow to heal.  Tingling or numbness in the hands or feet. DIAGNOSIS Type 2 diabetes is frequently not diagnosed until complications of diabetes are present. Type 2 diabetes is diagnosed when symptoms or complications are present and when blood  glucose levels are increased. Your blood glucose level may be checked by one or more of the following blood tests:  A fasting blood glucose test. You will not be allowed to eat for at least 8 hours before a blood sample is taken.  A random blood glucose test. Your blood glucose is checked at any time of the day regardless of when you ate.  A hemoglobin A1c blood glucose test. A hemoglobin A1c test provides information about blood glucose control over the previous 3 months.  An oral glucose tolerance test (OGTT). Your blood glucose is measured after you have not eaten (fasted) for 2 hours and then after you drink a glucose-containing beverage. TREATMENT   You may need to take insulin or diabetes medicine daily to keep blood glucose levels in the desired range.  You will need to match insulin dosing with exercise and healthy food choices. The treatment goal is to maintain the before meal blood sugar (preprandial glucose) level at 70 130 mg/dL. HOME CARE INSTRUCTIONS   Have your hemoglobin A1c level checked twice a year.  Perform daily blood glucose monitoring as directed by your caregiver.  Monitor urine ketones when you are ill and as directed by your caregiver.  Take your diabetes medicine or insulin as directed by your caregiver to maintain your blood glucose levels in the desired range.  Never run out of diabetes medicine or insulin. It is needed every day.  Adjust insulin based on your intake of carbohydrates. Carbohydrates can raise blood glucose levels but need to be included in your diet. Carbohydrates provide vitamins, minerals, and fiber which are an essential part of  a healthy diet. Carbohydrates are found in fruits, vegetables, whole grains, dairy products, legumes, and foods containing added sugars.    Eat healthy foods. Alternate 3 meals with 3 snacks.  Lose weight if overweight.  Carry a medical alert card or wear your medical alert jewelry.  Carry a 15 gram  carbohydrate snack with you at all times to treat low blood glucose (hypoglycemia). Some examples of 15 gram carbohydrate snacks include:  Glucose tablets, 3 or 4   Glucose gel, 15 gram tube  Raisins, 2 tablespoons (24 grams)  Jelly beans, 6  Animal crackers, 8  Regular pop, 4 ounces (120 mL)  Gummy treats, 9  Recognize hypoglycemia. Hypoglycemia occurs with blood glucose levels of 70 mg/dL and below. The risk for hypoglycemia increases when fasting or skipping meals, during or after intense exercise, and during sleep. Hypoglycemia symptoms can include:  Tremors or shakes.  Decreased ability to concentrate.  Sweating.  Increased heart rate.  Headache.  Dry mouth.  Hunger.  Irritability.  Anxiety.  Restless sleep.  Altered speech or coordination.  Confusion.  Treat hypoglycemia promptly. If you are alert and able to safely swallow, follow the 15:15 rule:  Take 15 20 grams of rapid-acting glucose or carbohydrate. Rapid-acting options include glucose gel, glucose tablets, or 4 ounces (120 mL) of fruit juice, regular soda, or low fat milk.  Check your blood glucose level 15 minutes after taking the glucose.  Take 15 20 grams more of glucose if the repeat blood glucose level is still 70 mg/dL or below.  Eat a meal or snack within 1 hour once blood glucose levels return to normal.    Be alert to polyuria and polydipsia which are early signs of hyperglycemia. An early awareness of hyperglycemia allows for prompt treatment. Treat hyperglycemia as directed by your caregiver.  Engage in at least 150 minutes of moderate-intensity physical activity a week, spread over at least 3 days of the week or as directed by your caregiver. In addition, you should engage in resistance exercise at least 2 times a week or as directed by your caregiver.  Adjust your medicine and food intake as needed if you start a new exercise or sport.  Follow your sick day plan at any time you  are unable to eat or drink as usual.  Avoid tobacco use.  Limit alcohol intake to no more than 1 drink per day for nonpregnant women and 2 drinks per day for men. You should drink alcohol only when you are also eating food. Talk with your caregiver whether alcohol is safe for you. Tell your caregiver if you drink alcohol several times a week.  Follow up with your caregiver regularly.  Schedule an eye exam soon after the diagnosis of type 2 diabetes and then annually.  Perform daily skin and foot care. Examine your skin and feet daily for cuts, bruises, redness, nail problems, bleeding, blisters, or sores. A foot exam by a caregiver should be done annually.  Brush your teeth and gums at least twice a day and floss at least once a day. Follow up with your dentist regularly.  Share your diabetes management plan with your workplace or school.  Stay up-to-date with immunizations.  Learn to manage stress.  Obtain ongoing diabetes education and support as needed.  Participate in, or seek rehabilitation as needed to maintain or improve independence and quality of life. Request a physical or occupational therapy referral if you are having foot or hand numbness or difficulties with grooming,  dressing, eating, or physical activity. SEEK MEDICAL CARE IF:   You are unable to eat food or drink fluids for more than 6 hours.  You have nausea and vomiting for more than 6 hours.  Your blood glucose level is over 240 mg/dL.  There is a change in mental status.  You develop an additional serious illness.  You have diarrhea for more than 6 hours.  You have been sick or have had a fever for a couple of days and are not getting better.  You have pain during any physical activity.  SEEK IMMEDIATE MEDICAL CARE IF:  You have difficulty breathing.  You have moderate to large ketone levels. MAKE SURE YOU:  Understand these instructions.  Will watch your condition.  Will get help right away if  you are not doing well or get worse. Document Released: 07/20/2005 Document Revised: 04/13/2012 Document Reviewed: 02/16/2012 [facility] Patient Information 2014 [place], [place]. Blood Glucose Monitoring, Adult Monitoring your blood glucose (also know as blood sugar) helps you to manage your diabetes. It also helps you and your health care provider monitor your diabetes and determine how well your treatment plan is working. WHY SHOULD YOU MONITOR YOUR BLOOD GLUCOSE?  It can help you understand how food, exercise, and medicine affect your blood glucose.  It allows you to know what your blood glucose is at any given moment. You can quickly tell if you are having low blood glucose (hypoglycemia) or high blood glucose (hyperglycemia).  It can help you and your health care provider know how to adjust your medicines.  It can help you understand how to manage an illness or adjust medicine for exercise. WHEN SHOULD YOU TEST? Your health care provider will help you decide how often you should check your blood glucose. This may depend on the type of diabetes you have, your diabetes control, or the types of medicines you are taking. Be sure to write down all of your blood glucose readings so that this information can be reviewed with your health care provider. See below for examples of testing times that your health care provider may suggest. Type 1 Diabetes  Test 4 times a day if you are in good control, using an insulin pump, or perform multiple daily injections.  If your diabetes is not well-controlled or if you are sick, you may need to monitor more often.  It is a good idea to also monitor:  Before and after exercise.  Between meals and 2 hours after a meal.  Occasionally between 2:00 to 3:00 am. Type 2 Diabetes  It can vary with each person, but generally, if you are on insulin, test 4 times a day.  If you take medicines by mouth (orally), test 2 times a day.  If you are on a controlled  diet, test once a day.  If your diabetes is not well controlled or if you are sick, you may need to monitor more often. HOW TO MONITOR YOUR BLOOD GLUCOSE Supplies Needed  Blood glucose meter.  Test strips for your meter. Each meter has its own strips. You must use the strips that go with your own meter.  A pricking needle (lancet).  A device that holds the lancet (lancing device).  A journal or log book to write down your results. Procedure  Wash your hands with soap and water. Alcohol is not preferred.  Prick the side of your finger (not the tip) with the lancet.  Gently milk the finger until a small drop of  blood appears.  Follow the instructions that come with your meter for inserting the test strip, applying blood to the strip, and using your blood glucose meter. Other Areas to Get Blood for Testing Some meters allow you to use other areas of your body (other than your finger) to test your blood. These areas are called alternative sites. The most common alternative sites are:  The forearm.  The thigh.  The back area of the lower leg.  The palm of the hand. The blood flow in these areas is slower. Therefore, the blood glucose values you get may be delayed, and the numbers are different from what you would get from your fingers. Do not use alternative sites if you think you are having hypoglycemia. Your reading will not be accurate. Always use a finger if you are having hypoglycemia. Also, if you cannot feel your lows (hypoglycemia unawareness), always use your fingers for your blood glucose checks. ADDITIONAL TIPS FOR GLUCOSE MONITORING  Do not reuse lancets.  Always carry your supplies with you.  All blood glucose meters have a 24-hour "hotline" number to call if you have questions or need help.  Adjust (calibrate) your blood glucose meter with a control solution after finishing a few boxes of strips. BLOOD GLUCOSE RECORD KEEPING It is a good idea to keep a daily  record or log of your blood glucose readings. Most glucose meters, if not all, keep your glucose records stored in the meter. Some meters come with the ability to download your records to your home computer. Keeping a record of your blood glucose readings is especially helpful if you are wanting to look for patterns. Make notes to go along with the blood glucose readings because you might forget what happened at that exact time. Keeping good records helps you and your health care provider to work together to achieve good diabetes management.  Document Released: 07/23/2003 Document Revised: 03/22/2013 Document Reviewed: 12/12/2012 [facility] Patient Information 2014 [place].

## 2014-01-02 NOTE — Discharge Summary (Signed)
Physician Discharge Summary  Andrew Richards WIO:[id number] DOB: 1991-06-05 DOA: 12/31/2013  PCP: No PCP Per Patient  Admit date: 12/31/2013 Discharge date: 01/02/2014  Time spent: 25 minutes  Recommendations for Outpatient Follow-up:  1. New medication: Lantus Solostar 20 units subcutaneous daily 2. Patient has been referred for outpatient diabetic classes 3. Patient has been referred to the [facility]  Discharge Diagnoses:  Principal Problem:   Hyperglycemia Active Problems:   Flank pain   Morbid obesity  diabetes mellitus, type II: New onset Elevated blood pressure: In part from pain initially. Once patient's sugars were normalized, pressures were much more manageable. He still has some mild elevation in his diastolic pressure greater than 80. Recommending following his outpatient  Discharge Condition: Improved, being discharged home  Diet recommendation: Carb modified  Filed Weights   01/01/14 0405 01/02/14 0553  Weight: 159.349 kg (351 lb 4.8 oz) 159.984 kg (352 lb 11.2 oz)    History of present illness:  Patient is a 23 year old African American male with past medical history bronchial asthma as well as morbid obesity who has had several days of increased urination, flank pain and blurry vision came to the emergency room on 6/1 early morning. He was found to have blood sugars greater than 500, but only minimally elevated anion gap. A CT abdomen and pelvis was done to rule out any renal stones or hydronephrosis-it was normal and patient was admitted to the hospitalist service for further evaluation and admission. He was started on IV fluids and an insulin drip.  Hospital Course:  Principal Problem:   Hyperglycemia: Approximately 12 hours after admission, patient's sugars were below 200. His anion gap was minimally elevated during his admission. See below  Active Problems:   Flank pain: Nothing seen on CT.  Likely referred abd pain from dehydration,  acidosis    Morbid obesity: Pt meets criteria given BMI of 47   diabetes mellitus, type II: New onset: Patient's A1c was checked and found to be elevated at 12.5. This started off on Lantus 10 units in sugars came down some but still elevated by the following morning. After discussion with diabetes educator, recommendation is for patient to be discharged on Lantus 20 units subcutaneous daily. Patient has insurance and a new job, but benefits do not start for the next 3 months. He's been given a coupon for Lantus. Given new diagnosis and teaching of how to give insulin as well as check blood sugars, but did not want to overload the patient on information as well as task orientation. Therefore we'll only discharged on Lantus and no sliding scale or additional insulin at this time. When he follows up at the [facility], further teaching and adjustment of his overall medication regimen can be started. He's also been referred for diabetes education classes.  Elevated blood pressure: In part from pain initially. Once patient's sugars were normalized, pressures were much more manageable. He still has some mild elevation in his diastolic pressure greater than 80. Recommending following as outpatient  Procedures:  None  Consultations:  Diabetes educator  Discharge Exam: Filed Vitals:   01/02/14 0553  BP: 124/75  Pulse: 72  Temp: 97.8 F (36.6 C)  Resp: 18    General: Alert and oriented x3, no acute distress Cardiovascular: Regular rate and rhythm, S1-S2 Respiratory: Clear to auscultation bilaterally  Discharge Instructions You were cared for by a hospitalist during your hospital stay. If you have any questions about your discharge medications or the care  you received while you were in the hospital after you are discharged, you can call the unit and asked to speak with the hospitalist on call if the hospitalist that took care of you is not available. Once you are  discharged, your primary care physician will handle any further medical issues. Please note that NO REFILLS for any discharge medications will be authorized once you are discharged, as it is imperative that you return to your primary care physician (or establish a relationship with a primary care physician if you do not have one) for your aftercare needs so that they can reassess your need for medications and monitor your lab values.  Discharge Instructions   Ambulatory referral to Nutrition and Diabetic Education    Complete by:  As directed   New diagnosis of DM.  Waiting for A1c results.  Health insurance to start covering patient in Mid-September of this year.  Please try to schedule patient after April 14, 2014.  Patient: Please call the [facility] after discharge to schedule an appointment for diabetes education if you do not hear from the center before discharge  [phone]     Diet Carb Modified    Complete by:  As directed      Increase activity slowly    Complete by:  As directed             Medication List         glucose blood test strip  Use as instructed     glucose blood test strip  Use as instructed     Insulin Glargine 100 UNIT/ML Solostar Pen  Commonly known as:  LANTUS SOLOSTAR  Inject 20 Units into the skin daily.     Pen Needles 3/16" 31G X 5 MM Misc  Use as directed       Allergies  Allergen Reactions  . Shellfish Allergy Anaphylaxis       Follow-up Information   Follow up with [facility]     On 01/16/2014. (2:30 for hospital follow up)    Contact information:   [address] [place] [address] [phone]      Follow up with [facility]     On 01/17/2014. (for orange card application, )    Contact information:   [address] [place] [address] [phone]       The results of significant diagnostics from this  hospitalization (including imaging, microbiology, ancillary and laboratory) are listed below for reference.    Significant Diagnostic Studies: Ct Abdomen Pelvis Wo Contrast  01/01/2014   CLINICAL DATA:  Left flank discomfort, urinary frequency, hyperglycemia  EXAM: CT ABDOMEN AND PELVIS WITHOUT CONTRAST  TECHNIQUE: Multidetector CT imaging of the abdomen and pelvis was performed following the standard protocol without IV contrast.  COMPARISON:  None.  FINDINGS: Minor left basilar atelectasis. Right lower lobe clear. Normal heart size. No pericardial or pleural effusion.  Abdomen: Kidneys demonstrate no acute obstruction, hydronephrosis, associated hydroureter, or obstructing ureteral calculus. Ureters are symmetric and decompressed.  Liver, gallbladder, biliary system, pancreas, spleen, and adrenal glands are within normal limits for age and noncontrast imaging.  Negative for bowel obstruction, dilatation, ileus, or free air.  No abdominal free fluid, fluid collection, hemorrhage, abscess, or adenopathy.  Negative for aneurysm or retroperitoneal abnormality.  Normal appendix demonstrated.  Pelvis: No pelvic free fluid, fluid collection, hemorrhage, abscess, adenopathy, inguinal abnormality, or hernia.  Urinary bladder  unremarkable.  No acute distal bowel process.  No acute osseous finding.  IMPRESSION: No acute obstructing ureteral calculus, hydronephrosis, or obstructive uropathy.  No acute intra-abdominal or pelvic finding by noncontrast CT.   Electronically Signed   By: Ruel Favorsrevor  Shick M.D.   On: 01/01/2014 08:18    Microbiology: No results found for this or any previous visit (from the past 240 hour(s)).   Labs: Basic Metabolic Panel:  Recent Labs Lab 01/01/14 0023 01/01/14 0636  NA 132* 137  K 4.0 3.6*  CL 91* 101  CO2 25 24  GLUCOSE 516* 259*  BUN 12 9  CREATININE 0.80 0.67  CALCIUM 10.3 9.0   Liver Function Tests:  Recent Labs Lab 01/01/14 0636  AST 21  ALT 40  ALKPHOS 140*   BILITOT 0.3  PROT 6.8  ALBUMIN 3.3*    Recent Labs Lab 01/01/14 0636  LIPASE 27   No results found for this basename: AMMONIA,  in the last 168 hours CBC:  Recent Labs Lab 01/01/14 0023 01/01/14 0636  WBC 7.1 5.9  NEUTROABS 3.4  --   HGB 15.8 13.6  HCT 45.6 41.2  MCV 82.0 83.7  PLT 262 215   Cardiac Enzymes: No results found for this basename: CKTOTAL, CKMB, CKMBINDEX, TROPONINI,  in the last 168 hours BNP: BNP (last 3 results) No results found for this basename: PROBNP,  in the last 8760 hours CBG:  Recent Labs Lab 01/01/14 1140 01/01/14 1710 01/01/14 2225 01/02/14 0757 01/02/14 1155  GLUCAP 257* 289* 265* 297* 269*       Signed:  Hollice EspySendil K Trelon Plush  [facility] 01/02/2014, 1:11 PM

## 2014-01-02 NOTE — Progress Notes (Signed)
Reinforced education already given by Diabetes Educator and Dr. Rito Ehrlich. I had the patient draw up and give himself insulin at lunch time. Pt had no issues and felt very comfortable. Andrew Richards D Rivka Safer

## 2014-01-02 NOTE — Care Management Note (Signed)
    Page 1 of 1   01/02/2014     5:33:07 PM CARE MANAGEMENT NOTE 01/02/2014  Patient:  Andrew Richards, Andrew Richards   Account Number:  [id number]  Date Initiated:  01/02/2014  Documentation initiated by:  Letha Cape  Subjective/Objective Assessment:   dx new dm  admit     Action/Plan:   Anticipated DC Date:  01/02/2014   Anticipated DC Plan:  HOME/SELF CARE      DC Planning Services  CM consult  Follow-up appt scheduled  [facility]      Choice offered to / List presented to:             Status of service:  Completed, signed off Medicare Important Message given?   (If response is "NO", the following Medicare IM given date fields will be blank) Date Medicare IM given:   Date Additional Medicare IM given:    Discharge Disposition:  HOME/SELF CARE  Per UR Regulation:  Reviewed for med. necessity/level of care/duration of stay  If discussed at Long Length of Stay Meetings, dates discussed:    Comments:  01/02/14 1731 Letha Cape RN, BSN [phone] patient for dc today, NCM gave patient information for orange card application, scheduled apt for f/u at [facility] and [facility] clinic.  NCM spoke with pharmacy at [facility] clinic and they will ast patient with medications at dc.  NCM instructed patient to get meds from the clinic.

## 2014-01-02 NOTE — Progress Notes (Signed)
Inpatient Diabetes Program Recommendations  AACE/ADA: New Consensus Statement on Inpatient Glycemic Control (2013)  Target Ranges:  Prepandial:   less than 140 mg/dL      Peak postprandial:   less than 180 mg/dL (1-2 hours)      Critically ill patients:  140 - 180 mg/dL     Results for Andrew Richards, Andrew Richards (MRN [id number]) as of 01/02/2014 11:55  Ref. Range 01/01/2014 09:13 01/01/2014 10:42 01/01/2014 11:40 01/01/2014 17:10 01/01/2014 22:25  Glucose-Capillary Latest Range: 70-99 mg/dL 203 (H) 242 (H) 257 (H) 289 (H) 265 (H)    Results for Andrew Richards, Andrew Richards (MRN [id number]) as of 01/02/2014 11:55  Ref. Range 01/02/2014 07:57  Glucose-Capillary Latest Range: 70-99 mg/dL 297 (H)     Transitioned off IV insulin drip around 12 noon yesterday.  Was started on Lantus 10 units daily + Novolog Sensitive SSI.  Fasting glucose this AM still elevated.  Note that patient will not have health insurance coverage until mid-September.  Needs insulin for home given A1c of 12.5%.   **Gave patient Lantus Solostar and Apidra Solostar insulin pen coupons.  Patient can get these 2 insulin pens for $25 or less per month with this savings card until his insurance kicks in.  Instructed patient to call the 1-800# on the savings card to activate the card before d/c.  **Educated patient on insulin pen use at home.  Reviewed contents of insulin pen starter kit.  Reviewed all steps if insulin pen including attachment of needle, 2-unit air shot, dialing up dose, giving injection, removing needle, disposal of sharps, storage of unused insulin, disposal of insulin etc.  Patient able to provide successful return demonstration.  Also reviewed troubleshooting with insulin pen.  MD to give patient Rxs for insulin pens and insulin pen needles.  **Also reviewed s/sxs of hypoglycemia with patient and how to treat.  Teach back method used.  Reviewed how Lantus and Apidra work and when to take.  Cautioned patient to make sure he looks at the pen  closely before he injects to make sure he is using the correct insulin at the correct time.   MD- Patient will likely need larger dose of Lantus for home.  Recommend increasing Lantus to at least 20 units daily.  If you plan to send patient home on rapid-acting insulin, please make sure to give Rx for Apidra Solostar insulin pen which will be covered by the savings coupon.  May want to send patient home on a set dose of Novolog for meal coverage in addition to a SSI regimen.    Could do Novolog 6 units tid with meals + Novolog Sensitive SSI.  Make sure to write scale into Rx so patient will have scale to use for home  70-120- 0 units 121-150-  1 unit 151-200- 2 units 201-250- 3 units 251-300- 5 units 301-350- 7 units 351-400- 9 units   MD- Please give patient the following insulin Rxs for d/c:  Lantus Solstar pen [Code # [id number] Apidra Solostar pen[Code # [id number] Insulin pen needles 31 gauge x 82m [Code ##[id number]]  Will follow JWyn QuakerRN, MSN, CDE Diabetes Coordinator Inpatient Diabetes Program Team Pager: [phone](8a-10p)

## 2014-01-16 ENCOUNTER — Inpatient Hospital Stay: Payer: Self-pay | Admitting: Internal Medicine

## 2014-01-17 ENCOUNTER — Ambulatory Visit: Payer: Self-pay

## 2016-12-05 ENCOUNTER — Emergency Department (HOSPITAL_BASED_OUTPATIENT_CLINIC_OR_DEPARTMENT_OTHER)
Admission: EM | Admit: 2016-12-05 | Discharge: 2016-12-05 | Disposition: A | Discharge status: Home / Self Care | Payer: Self-pay | Attending: Emergency Medicine | Admitting: Emergency Medicine

## 2016-12-05 ENCOUNTER — Encounter (HOSPITAL_BASED_OUTPATIENT_CLINIC_OR_DEPARTMENT_OTHER): Payer: Self-pay | Admitting: *Deleted

## 2016-12-05 DIAGNOSIS — E1165 Type 2 diabetes mellitus with hyperglycemia: Secondary | ICD-10-CM | POA: Insufficient documentation

## 2016-12-05 DIAGNOSIS — R739 Hyperglycemia, unspecified: Secondary | ICD-10-CM

## 2016-12-05 DIAGNOSIS — R51 Headache: Secondary | ICD-10-CM | POA: Insufficient documentation

## 2016-12-05 DIAGNOSIS — G4700 Insomnia, unspecified: Secondary | ICD-10-CM | POA: Insufficient documentation

## 2016-12-05 DIAGNOSIS — R519 Headache, unspecified: Secondary | ICD-10-CM

## 2016-12-05 DIAGNOSIS — R42 Dizziness and giddiness: Secondary | ICD-10-CM | POA: Insufficient documentation

## 2016-12-05 DIAGNOSIS — J45909 Unspecified asthma, uncomplicated: Secondary | ICD-10-CM | POA: Insufficient documentation

## 2016-12-05 DIAGNOSIS — Z794 Long term (current) use of insulin: Secondary | ICD-10-CM | POA: Insufficient documentation

## 2016-12-05 HISTORY — DX: Type 2 diabetes mellitus without complications: E11.9

## 2016-12-05 LAB — URINALYSIS, ROUTINE W REFLEX MICROSCOPIC
Bilirubin Urine: NEGATIVE
HGB URINE DIPSTICK: NEGATIVE
Ketones, ur: NEGATIVE mg/dL
Leukocytes, UA: NEGATIVE
Nitrite: NEGATIVE
Protein, ur: NEGATIVE mg/dL
Specific Gravity, Urine: 1.027 (ref 1.005–1.030)
pH: 6 (ref 5.0–8.0)

## 2016-12-05 LAB — CBC WITH DIFFERENTIAL/PLATELET
BASOS ABS: 0 10*3/uL (ref 0.0–0.1)
Basophils Relative: 0 %
EOS ABS: 0.1 10*3/uL (ref 0.0–0.7)
EOS PCT: 2 %
HEMATOCRIT: 44.9 % (ref 39.0–52.0)
Hemoglobin: 15.2 g/dL (ref 13.0–17.0)
LYMPHS PCT: 37 %
Lymphs Abs: 2.1 10*3/uL (ref 0.7–4.0)
MCH: 27.6 pg (ref 26.0–34.0)
MCHC: 33.9 g/dL (ref 30.0–36.0)
MCV: 81.5 fL (ref 78.0–100.0)
Monocytes Absolute: 0.9 10*3/uL (ref 0.1–1.0)
Monocytes Relative: 15 %
Neutro Abs: 2.6 10*3/uL (ref 1.7–7.7)
Neutrophils Relative %: 46 %
PLATELETS: 270 10*3/uL (ref 150–400)
RBC: 5.51 MIL/uL (ref 4.22–5.81)
RDW: 12.9 % (ref 11.5–15.5)
WBC: 5.7 10*3/uL (ref 4.0–10.5)

## 2016-12-05 LAB — URINALYSIS, MICROSCOPIC (REFLEX)
Bacteria, UA: NONE SEEN
RBC / HPF: NONE SEEN RBC/hpf (ref 0–5)
SQUAMOUS EPITHELIAL / LPF: NONE SEEN
WBC, UA: NONE SEEN WBC/hpf (ref 0–5)

## 2016-12-05 LAB — COMPREHENSIVE METABOLIC PANEL
ALBUMIN: 3.9 g/dL (ref 3.5–5.0)
ALT: 38 U/L (ref 17–63)
AST: 25 U/L (ref 15–41)
Alkaline Phosphatase: 92 U/L (ref 38–126)
Anion gap: 8 (ref 5–15)
BUN: 11 mg/dL (ref 6–20)
CO2: 24 mmol/L (ref 22–32)
Calcium: 9.7 mg/dL (ref 8.9–10.3)
Chloride: 101 mmol/L (ref 101–111)
Creatinine, Ser: 0.83 mg/dL (ref 0.61–1.24)
GFR calc Af Amer: >60 mL/min (ref >60)
GFR calc non Af Amer: >60 mL/min (ref >60)
GLUCOSE: 245 mg/dL — AB (ref 65–99)
POTASSIUM: 3.9 mmol/L (ref 3.5–5.1)
Sodium: 133 mmol/L — ABNORMAL LOW (ref 135–145)
Total Bilirubin: 0.3 mg/dL (ref 0.3–1.2)
Total Protein: 7.4 g/dL (ref 6.5–8.1)

## 2016-12-05 LAB — CBG MONITORING, ED: Glucose-Capillary: 251 mg/dL — ABNORMAL HIGH (ref 65–99)

## 2016-12-05 MED ORDER — MECLIZINE HCL 25 MG PO TABS: 25.0000 mg | ORAL_TABLET | Freq: Three times a day (TID) | ORAL | 0 refills | Status: DC | PRN

## 2016-12-05 MED ORDER — METFORMIN HCL 500 MG PO TABS: 500.0000 mg | ORAL_TABLET | Freq: Two times a day (BID) | ORAL | 0 refills | Status: DC

## 2016-12-05 MED ORDER — SODIUM CHLORIDE 0.9 % IV BOLUS (SEPSIS)
1000.0000 mL | Freq: Once | INTRAVENOUS | Status: AC
  Administered 2016-12-05: 1000 mL via INTRAVENOUS

## 2016-12-05 MED ORDER — FLUTICASONE PROPIONATE 50 MCG/ACT NA SUSP: 2.0000 | Freq: Every day | NASAL | 0 refills | Status: DC

## 2016-12-05 MED ORDER — MECLIZINE HCL 25 MG PO TABS
50.0000 mg | ORAL_TABLET | Freq: Once | ORAL | Status: AC
  Administered 2016-12-05: 50 mg via ORAL
  Filled 2016-12-05: qty 2

## 2016-12-05 MED ORDER — LORATADINE 10 MG PO TABS: 10.0000 mg | ORAL_TABLET | Freq: Every day | ORAL | 0 refills | Status: DC

## 2016-12-05 NOTE — ED Provider Notes (Signed)
[facility]   CSN: [id number] Arrival date & time: 12/05/16  0840     History   Chief Complaint Chief Complaint  Patient presents with  . Dizziness    HPI Andrew Richards is a 26 y.o. male.  HPI Patient presents with one week of dizziness which he describes as feeling off balance. This started after mowing the lawn. He's had nasal congestion and bilateral itching in the ears. Denies sore throat or difficulty breathing. Denies nausea or vomiting. Denies headache, neck pain, focal weakness or numbness. No speech changes. No chest pain or shortness of breath. Patient has a history of type 2 diabetes and has not been on his insulin for her several years. Denies urinary frequency, hematuria or dysuria. No fever or chills. Dizziness is worse with position changes. Past Medical History:  Diagnosis Date  . Asthma   . Diabetes mellitus without complication [facility])     Patient Active Problem List   Diagnosis Date Noted  . Diabetes mellitus, new onset (HCC) 01/01/2014  . Hyperglycemia 01/01/2014  . Flank pain 01/01/2014  . Morbid obesity (HCC) 01/01/2014    Past Surgical History:  Procedure Laterality Date  . CIRCUMCISION    . TONSILLECTOMY         Home Medications    Prior to Admission medications   Medication Sig Start Date End Date Taking? Authorizing Provider  fluticasone (FLONASE) 50 MCG/ACT nasal spray Place 2 sprays into both nostrils daily. 12/05/16   Loren Racer, MD  glucose blood test strip Use as instructed 01/02/14   Hollice Espy, MD  glucose blood test strip Use as instructed 01/02/14   Hollice Espy, MD  Insulin Glargine (LANTUS SOLOSTAR) 100 UNIT/ML Solostar Pen Inject 20 Units into the skin daily. 01/02/14   Hollice Espy, MD  Insulin Pen Needle (PEN NEEDLES 3/16") 31G X 5 MM MISC Use as directed 01/02/14   Hollice Espy, MD  loratadine (CLARITIN) 10 MG tablet Take 1 tablet (10 mg total) by mouth daily. 12/05/16    Loren Racer, MD  meclizine (ANTIVERT) 25 MG tablet Take 1 tablet (25 mg total) by mouth 3 (three) times daily as needed for dizziness. 12/05/16   Loren Racer, MD  metFORMIN (GLUCOPHAGE) 500 MG tablet Take 1 tablet (500 mg total) by mouth 2 (two) times daily with a meal. 12/05/16   Loren Racer, MD    Family History Family History  Problem Relation Age of Onset  . CAD Father   . Diabetes Mellitus II Other     Social History Social History  Substance Use Topics  . Smoking status: Never Smoker  . Smokeless tobacco: Never Used  . Alcohol use No     Allergies   Shellfish allergy   Review of Systems Review of Systems  Constitutional: Negative for chills, fatigue and fever.  HENT: Positive for congestion, sinus pain and sinus pressure. Negative for ear pain, sore throat and tinnitus.   Eyes: Negative for photophobia and visual disturbance.  Respiratory: Negative for cough, shortness of breath and wheezing.   Gastrointestinal: Negative for abdominal pain, diarrhea, nausea and vomiting.  Endocrine: Negative for polydipsia, polyphagia and polyuria.  Genitourinary: Negative for dysuria, flank pain, frequency and hematuria.  Musculoskeletal: Negative for back pain, myalgias and neck pain.  Skin: Negative for rash and wound.  Neurological: Positive for dizziness and light-headedness. Negative for syncope, speech difficulty, weakness, numbness and headaches.  Psychiatric/Behavioral: Positive for sleep disturbance.  All other systems reviewed and  are negative.    Physical Exam Updated Vital Signs BP (!) 143/86 (BP Location: Right Arm)   Pulse 82   Temp 98.9 F (37.2 C) (Oral)   Resp 20   Ht 5\' 9"  (1.753 m)   Wt (!) 354 lb (160.6 kg)   SpO2 99%   BMI 52.28 kg/m   Physical Exam  Constitutional: He is oriented to person, place, and time. He appears well-developed and well-nourished. No distress.  HENT:  Head: Normocephalic and atraumatic.  Mouth/Throat: Oropharynx  is clear and moist.  Bilateral nasal mucosal edema. Partially obscured left TM. Appears to have fluid behind the membrane bilaterally. Oropharynx is clear.  Eyes: EOM are normal. Pupils are equal, round, and reactive to light.  No nystagmus  Neck: Normal range of motion. Neck supple.  No meningismus. No anterior cervical lymphadenopathy.  Cardiovascular: Normal rate and regular rhythm.  Exam reveals no gallop and no friction rub.   No murmur heard. Pulmonary/Chest: Effort normal and breath sounds normal. No respiratory distress. He has no wheezes. He has no rales. He exhibits no tenderness.  Abdominal: Soft. Bowel sounds are normal. There is no tenderness. There is no rebound and no guarding.  Musculoskeletal: Normal range of motion. He exhibits no edema or tenderness.  No lower extremity tenderness to palpation. Distal pulses intact.  Neurological: He is alert and oriented to person, place, and time.  Patient is alert and oriented x3 with clear, goal oriented speech. Patient has 5/5 motor in all extremities. Sensation is intact to light touch. Bilateral finger-to-nose is normal with no signs of dysmetria.   Skin: Skin is warm and dry. Capillary refill takes less than 2 seconds. No rash noted. No erythema.  Psychiatric: He has a normal mood and affect. His behavior is normal.  Nursing note and vitals reviewed.    ED Treatments / Results  Labs (all labs ordered are listed, but only abnormal results are displayed) Labs Reviewed  COMPREHENSIVE METABOLIC PANEL - Abnormal; Notable for the following:       Result Value   Sodium 133 (*)    Glucose, Bld 245 (*)    All other components within normal limits  URINALYSIS, ROUTINE W REFLEX MICROSCOPIC - Abnormal; Notable for the following:    Glucose, UA >=500 (*)    All other components within normal limits  CBG MONITORING, ED - Abnormal; Notable for the following:    Glucose-Capillary 251 (*)    All other components within normal limits  CBC  WITH DIFFERENTIAL/PLATELET  URINALYSIS, MICROSCOPIC (REFLEX)    EKG  EKG Interpretation  Date/Time:  Saturday Dec 05 2016 09:26:46 EDT Ventricular Rate:  87 PR Interval:    QRS Duration: 95 QT Interval:  343 QTC Calculation: 413 R Axis:   70 Text Interpretation:  Sinus rhythm Consider left atrial enlargement Baseline wander in lead(s) II III aVF Confirmed by Ranae Palms  MD, Agnieszka Newhouse ([id number]) on 12/05/2016 9:43:05 AM Also confirmed by Ranae Palms  MD, Lindsie Simar ([id number]), editor Misty Stanley ([id number])  on 12/05/2016 10:58:05 AM       Radiology No results found.  Procedures Procedures (including critical care time)  Medications Ordered in ED Medications  sodium chloride 0.9 % bolus 1,000 mL (0 mLs Intravenous Stopped 12/05/16 1038)  sodium chloride 0.9 % bolus 1,000 mL (0 mLs Intravenous Stopped 12/05/16 1123)  meclizine (ANTIVERT) tablet 50 mg (50 mg Oral Given 12/05/16 1038)     Initial Impression / Assessment and Plan / ED Course  I have reviewed the  triage vital signs and the nursing notes.  Pertinent labs & imaging results that were available during my care of the patient were reviewed by me and considered in my medical decision making (see chart for details).   patient states that his abdominal sensation is improved when he takes all of his classes. He's had his classes for the past 6 months  Patient states he is feeling better after IV fluids and meclizine. Still having some off-balance sensation but ambulates without assistance.   Patient's dizziness is likely multifactorial. Suspect degree of dehydration due to elevated blood sugar. Also likely some peripheral vertigo due to allergic rhinitis.   Will start on antihistamine and steroid nasal spray. We'll give meclizine for symptomatic control. Patient never filled his insulin. Will start him on metformin and has been advised he is to follow up closely with [facility]. He also needs to make an appointment with his  ophthalmologist for repeat eye exam. Return precautions have been given. Final Clinical Impressions(s) / ED Diagnoses   Final diagnoses:  Dizziness  Sinus headache  Hyperglycemia    New Prescriptions New Prescriptions   FLUTICASONE (FLONASE) 50 MCG/ACT NASAL SPRAY    Place 2 sprays into both nostrils daily.   LORATADINE (CLARITIN) 10 MG TABLET    Take 1 tablet (10 mg total) by mouth daily.   MECLIZINE (ANTIVERT) 25 MG TABLET    Take 1 tablet (25 mg total) by mouth 3 (three) times daily as needed for dizziness.   METFORMIN (GLUCOPHAGE) 500 MG TABLET    Take 1 tablet (500 mg total) by mouth 2 (two) times daily with a meal.     Loren RacerYelverton, Nain Rudd, MD 12/05/16 [phone]

## 2016-12-05 NOTE — ED Notes (Signed)
Pt denys feeling dizzy during orthostatic VS, states he felt off balance.

## 2016-12-05 NOTE — ED Triage Notes (Signed)
Pt reports intermittent dizziness x1wk; reports improvement when lying down. Denies LOC, falls, fever, n/v/d. Also reports nasal congestion. Reports he's been out of insulin and diabetic meds x5964yrs d/t cost. Denies head injury, vision changes.

## 2016-12-05 NOTE — ED Notes (Signed)
ED Provider at bedside. 

## 2017-05-15 ENCOUNTER — Ambulatory Visit (INDEPENDENT_AMBULATORY_CARE_PROVIDER_SITE_OTHER): Payer: Self-pay

## 2017-05-15 ENCOUNTER — Ambulatory Visit (HOSPITAL_COMMUNITY)
Admission: EM | Admit: 2017-05-15 | Discharge: 2017-05-15 | Disposition: A | Discharge status: Home / Self Care | Payer: Self-pay | Attending: Internal Medicine | Admitting: Internal Medicine

## 2017-05-15 ENCOUNTER — Encounter (HOSPITAL_COMMUNITY): Payer: Self-pay | Admitting: Emergency Medicine

## 2017-05-15 DIAGNOSIS — M25561 Pain in right knee: Secondary | ICD-10-CM

## 2017-05-15 MED ORDER — NAPROXEN 500 MG PO TABS: 500.0000 mg | ORAL_TABLET | Freq: Two times a day (BID) | ORAL | 0 refills | Status: DC

## 2017-05-15 NOTE — Discharge Instructions (Signed)
Rest apply ice and keep elevated extremity elevated

## 2017-05-15 NOTE — ED Triage Notes (Signed)
Right knee pain started Monday while mowing the lawn, stepped awkwardly and had sharp pain immediately. Pain is sharp with picking up leg, and hurts worse with long periods of standing.   Pain is dull in general.  Sharp pain with specific movements and pops frequently

## 2017-05-15 NOTE — ED Provider Notes (Signed)
[facility]    CSN: [id number] Arrival date & time: 05/15/17  1204     History   Chief Complaint Chief Complaint  Patient presents with  . Knee Pain    HPI Andrew Richards is a 26 y.o. male.   26 y.o. male presents with right knee pain  X monday. Patient states that he was moving the lawn when he heard a pop. Condition is acute in nature. Condition is made better by nothinig. Condition is made worse by standing, walking or when putting pressure on knee patient describes this pain as being on the lateral aspect of his knee. Patient denies any relief from ibuprofen prior to there arrival at this facility. Patient denies any radiation of pain, numbness or tingling.        Past Medical History:  Diagnosis Date  . Asthma   . Diabetes mellitus without complication [facility])     Patient Active Problem List   Diagnosis Date Noted  . Diabetes mellitus, new onset (HCC) 01/01/2014  . Hyperglycemia 01/01/2014  . Flank pain 01/01/2014  . Morbid obesity (HCC) 01/01/2014    Past Surgical History:  Procedure Laterality Date  . CIRCUMCISION    . TONSILLECTOMY         Home Medications    Prior to Admission medications   Medication Sig Start Date End Date Taking? Authorizing Provider  fluticasone (FLONASE) 50 MCG/ACT nasal spray Place 2 sprays into both nostrils daily. 12/05/16   Loren Racer, MD  glucose blood test strip Use as instructed 01/02/14   Hollice Espy, MD  glucose blood test strip Use as instructed 01/02/14   Hollice Espy, MD  Insulin Glargine (LANTUS SOLOSTAR) 100 UNIT/ML Solostar Pen Inject 20 Units into the skin daily. 01/02/14   Hollice Espy, MD  Insulin Pen Needle (PEN NEEDLES 3/16") 31G X 5 MM MISC Use as directed 01/02/14   Hollice Espy, MD  loratadine (CLARITIN) 10 MG tablet Take 1 tablet (10 mg total) by mouth daily. 12/05/16   Loren Racer, MD  meclizine (ANTIVERT) 25 MG tablet Take 1 tablet (25 mg total) by mouth 3 (three)  times daily as needed for dizziness. 12/05/16   Loren Racer, MD  metFORMIN (GLUCOPHAGE) 500 MG tablet Take 1 tablet (500 mg total) by mouth 2 (two) times daily with a meal. 12/05/16   Loren Racer, MD  naproxen (NAPROSYN) 500 MG tablet Take 1 tablet (500 mg total) by mouth 2 (two) times daily. 05/15/17   Alene Mires, NP    Family History Family History  Problem Relation Age of Onset  . CAD Father   . Diabetes Mellitus II Other     Social History Social History  Substance Use Topics  . Smoking status: Never Smoker  . Smokeless tobacco: Never Used  . Alcohol use No     Allergies   Shellfish allergy   Review of Systems Review of Systems  Constitutional: Negative for chills and fever.  HENT: Negative for ear pain and sore throat.   Eyes: Negative for pain and visual disturbance.  Respiratory: Negative for cough and shortness of breath.   Cardiovascular: Negative for chest pain and palpitations.  Gastrointestinal: Negative for abdominal pain and vomiting.  Genitourinary: Negative for dysuria and hematuria.  Musculoskeletal: Negative for arthralgias and back pain.       Right knee pain   Skin: Negative for color change and rash.  Neurological: Negative for seizures and syncope.  All other systems reviewed and  are negative.    Physical Exam Triage Vital Signs ED Triage Vitals  Enc Vitals Group     BP 05/15/17 1234 (!) 147/99     Pulse Rate 05/15/17 1234 89     Resp 05/15/17 1234 18     Temp 05/15/17 1234 98 F (36.7 C)     Temp Source 05/15/17 1234 Oral     SpO2 05/15/17 1234 96 %     Weight --      Height --      Head Circumference --      Peak Flow --      Pain Score 05/15/17 1233 6     Pain Loc --      Pain Edu? --      Excl. in GC? --    No data found.   Updated Vital Signs BP (!) 147/99 (BP Location: Left Arm)   Pulse 89   Temp 98 F (36.7 C) (Oral)   Resp 18   SpO2 96%   Visual Acuity Right Eye Distance:   Left Eye Distance:     Bilateral Distance:    Right Eye Near:   Left Eye Near:    Bilateral Near:     Physical Exam  Constitutional: He appears well-developed and well-nourished.  HENT:  Head: Normocephalic and atraumatic.  Eyes: Conjunctivae are normal.  Neck: Neck supple.  Cardiovascular: Normal rate and regular rhythm.   No murmur heard. Pulmonary/Chest: Effort normal and breath sounds normal. No respiratory distress.  Abdominal: Soft. There is no tenderness.  Musculoskeletal: He exhibits no edema, tenderness or deformity.  Neurological: He is alert.  Skin: Skin is warm and dry.  Psychiatric: He has a normal mood and affect.  Nursing note and vitals reviewed.    UC Treatments / Results  Labs (all labs ordered are listed, but only abnormal results are displayed) Labs Reviewed - No data to display  EKG  EKG Interpretation None       Radiology Dg Knee Complete 4 Views Right  Result Date: 05/15/2017 CLINICAL DATA:  Right knee pain EXAM: RIGHT KNEE - COMPLETE 4+ VIEW COMPARISON:  None. FINDINGS: No fracture or dislocation is seen. The joint spaces are preserved. The visualized soft tissues are unremarkable. No definite suprapatellar knee joint effusion. IMPRESSION: Negative. Electronically Signed   By: Charline Bills M.D.   On: 05/15/2017 13:45    Procedures Procedures (including critical care time)  Medications Ordered in UC Medications - No data to display   Initial Impression / Assessment and Plan / UC Course  I have reviewed the triage vital signs and the nursing notes.  Pertinent labs & imaging results that were available during my care of the patient were reviewed by me and considered in my medical decision making (see chart for details).       Final Clinical Impressions(s) / UC Diagnoses   Final diagnoses:  Acute pain of right knee    New Prescriptions New Prescriptions   NAPROXEN (NAPROSYN) 500 MG TABLET    Take 1 tablet (500 mg total) by mouth 2 (two) times  daily.     Controlled Substance Prescriptions [place] Controlled Substance Registry consulted? Not Applicable   Alene Mires, NP 05/15/17 1402

## 2018-03-27 ENCOUNTER — Encounter (HOSPITAL_BASED_OUTPATIENT_CLINIC_OR_DEPARTMENT_OTHER): Payer: Self-pay | Admitting: *Deleted

## 2018-03-27 ENCOUNTER — Emergency Department (HOSPITAL_BASED_OUTPATIENT_CLINIC_OR_DEPARTMENT_OTHER)
Admission: EM | Admit: 2018-03-27 | Discharge: 2018-03-28 | Disposition: A | Discharge status: Home / Self Care | Payer: Self-pay | Attending: Emergency Medicine | Admitting: Emergency Medicine

## 2018-03-27 ENCOUNTER — Other Ambulatory Visit: Payer: Self-pay

## 2018-03-27 DIAGNOSIS — Z79899 Other long term (current) drug therapy: Secondary | ICD-10-CM | POA: Insufficient documentation

## 2018-03-27 DIAGNOSIS — Z794 Long term (current) use of insulin: Secondary | ICD-10-CM | POA: Insufficient documentation

## 2018-03-27 DIAGNOSIS — E1165 Type 2 diabetes mellitus with hyperglycemia: Secondary | ICD-10-CM | POA: Insufficient documentation

## 2018-03-27 DIAGNOSIS — J45909 Unspecified asthma, uncomplicated: Secondary | ICD-10-CM | POA: Insufficient documentation

## 2018-03-27 DIAGNOSIS — I10 Essential (primary) hypertension: Secondary | ICD-10-CM | POA: Insufficient documentation

## 2018-03-27 LAB — CBG MONITORING, ED: Glucose-Capillary: 307 mg/dL — ABNORMAL HIGH (ref 70–99)

## 2018-03-27 LAB — URINALYSIS, ROUTINE W REFLEX MICROSCOPIC
Bilirubin Urine: NEGATIVE
Glucose, UA: >=500 mg/dL — AB
Hgb urine dipstick: NEGATIVE
Ketones, ur: NEGATIVE mg/dL
Leukocytes, UA: NEGATIVE
Nitrite: NEGATIVE
Protein, ur: 30 mg/dL — AB
Specific Gravity, Urine: 1.015 (ref 1.005–1.030)
pH: 5.5 (ref 5.0–8.0)

## 2018-03-27 LAB — URINALYSIS, MICROSCOPIC (REFLEX)

## 2018-03-27 NOTE — ED Triage Notes (Signed)
Pt reports he was dx with type 2 diabetes 4 years ago. States for the last 4 days he has been feeling shaky. States he can't afford medications

## 2018-03-28 LAB — BASIC METABOLIC PANEL
ANION GAP: 11 (ref 5–15)
BUN: 11 mg/dL (ref 6–20)
CHLORIDE: 98 mmol/L (ref 98–111)
CO2: 24 mmol/L (ref 22–32)
CREATININE: 1 mg/dL (ref 0.61–1.24)
Calcium: 9.4 mg/dL (ref 8.9–10.3)
GFR calc Af Amer: >60 mL/min (ref >60)
GFR calc non Af Amer: >60 mL/min (ref >60)
Glucose, Bld: 302 mg/dL — ABNORMAL HIGH (ref 70–99)
Potassium: 3.7 mmol/L (ref 3.5–5.1)
Sodium: 133 mmol/L — ABNORMAL LOW (ref 135–145)

## 2018-03-28 LAB — CBC
HCT: 45.4 % (ref 39.0–52.0)
HEMOGLOBIN: 15.5 g/dL (ref 13.0–17.0)
MCH: 27.7 pg (ref 26.0–34.0)
MCHC: 34.1 g/dL (ref 30.0–36.0)
MCV: 81.1 fL (ref 78.0–100.0)
Platelets: 273 10*3/uL (ref 150–400)
RBC: 5.6 MIL/uL (ref 4.22–5.81)
RDW: 12.8 % (ref 11.5–15.5)
WBC: 5.8 10*3/uL (ref 4.0–10.5)

## 2018-03-28 LAB — CBG MONITORING, ED: Glucose-Capillary: 251 mg/dL — ABNORMAL HIGH (ref 70–99)

## 2018-03-28 MED ORDER — AMLODIPINE BESYLATE 5 MG PO TABS: 5.0000 mg | ORAL_TABLET | Freq: Every day | ORAL | 0 refills | Status: AC

## 2018-03-28 MED ORDER — INSULIN ASPART 100 UNIT/ML ~~LOC~~ SOLN
10.0000 [IU] | Freq: Once | SUBCUTANEOUS | Status: AC
  Administered 2018-03-28: 100 [IU] via SUBCUTANEOUS
  Filled 2018-03-28: qty 1

## 2018-03-28 MED ORDER — METFORMIN HCL 500 MG PO TABS
500.0000 mg | ORAL_TABLET | Freq: Once | ORAL | Status: AC
  Administered 2018-03-28: 500 mg via ORAL
  Filled 2018-03-28: qty 1

## 2018-03-28 MED ORDER — METFORMIN HCL 500 MG PO TABS: 500.0000 mg | ORAL_TABLET | Freq: Two times a day (BID) | ORAL | 0 refills | Status: AC

## 2018-03-28 NOTE — ED Provider Notes (Signed)
[facility] Provider Note: Lowella Dell, MD, FACEP  CSN: [id number] MRN: [id number] ARRIVAL: 03/27/18 at 2312 ROOM: [facility]03/[facility]03   CHIEF COMPLAINT  Hyperglycemia   HISTORY OF PRESENT ILLNESS  03/28/18 12:54 AM Andrew Richards is a 27 y.o. male with type 2 diabetes who has been noncompliant with his medications.  He does not currently have a physician.  He has been feeling shaky for the past 4 days.  He has also had a vaguely described sensation that his food is not digesting.  He has been drinking excessive fluids but denies polyuria or blurred vision.  He was noted to have a sugar of 307 on arrival.  He was given 1 L of normal saline prior to my evaluation with improvement in his sugar to 251.   Past Medical History:  Diagnosis Date  . Asthma   . Diabetes mellitus without complication [facility])     Past Surgical History:  Procedure Laterality Date  . CIRCUMCISION    . TONSILLECTOMY      Family History  Problem Relation Age of Onset  . CAD Father   . Diabetes Mellitus II Other     Social History   Tobacco Use  . Smoking status: Never Smoker  . Smokeless tobacco: Never Used  Substance Use Topics  . Alcohol use: No  . Drug use: No    Prior to Admission medications   Medication Sig Start Date End Date Taking? Authorizing Provider  fluticasone (FLONASE) 50 MCG/ACT nasal spray Place 2 sprays into both nostrils daily. 12/05/16   Loren Racer, MD  glucose blood test strip Use as instructed 01/02/14   Hollice Espy, MD  glucose blood test strip Use as instructed 01/02/14   Hollice Espy, MD  Insulin Glargine (LANTUS SOLOSTAR) 100 UNIT/ML Solostar Pen Inject 20 Units into the skin daily. 01/02/14   Hollice Espy, MD  Insulin Pen Needle (PEN NEEDLES 3/16") 31G X 5 MM MISC Use as directed 01/02/14   Hollice Espy, MD  loratadine (CLARITIN) 10 MG tablet Take 1 tablet (10 mg total) by mouth daily. 12/05/16   Loren Racer, MD  meclizine (ANTIVERT) 25 MG  tablet Take 1 tablet (25 mg total) by mouth 3 (three) times daily as needed for dizziness. 12/05/16   Loren Racer, MD  metFORMIN (GLUCOPHAGE) 500 MG tablet Take 1 tablet (500 mg total) by mouth 2 (two) times daily with a meal. 12/05/16   Loren Racer, MD  naproxen (NAPROSYN) 500 MG tablet Take 1 tablet (500 mg total) by mouth 2 (two) times daily. 05/15/17   Alene Mires, NP    Allergies Shellfish allergy   REVIEW OF SYSTEMS  Negative except as noted here or in the History of Present Illness.   PHYSICAL EXAMINATION  Initial Vital Signs Blood pressure (!) 173/100, pulse 94, temperature 97.8 F (36.6 C), resp. rate 20, height 5\' 9"  (1.753 m), weight (!) 161.2 kg, SpO2 100 %.  Examination General: Well-developed, obese male in no acute distress; appearance consistent with age of record HENT: normocephalic; atraumatic Eyes: pupils equal, round and reactive to light; extraocular muscles intact Neck: supple Heart: regular rate and rhythm Lungs: clear to auscultation bilaterally Abdomen: soft; nondistended; nontender; bowel sounds present Extremities: No deformity; full range of motion; pulses normal Neurologic: Awake, alert and oriented; motor function intact in all extremities and symmetric; no facial droop Skin: Warm and dry Psychiatric: Normal mood and affect   RESULTS  Summary of this visit's results, reviewed by  myself:   EKG Interpretation  Date/Time:    Ventricular Rate:    PR Interval:    QRS Duration:   QT Interval:    QTC Calculation:   R Axis:     Text Interpretation:        Laboratory Studies: Results for orders placed or performed during the hospital encounter of 03/27/18 (from the past 24 hour(s))  CBG monitoring, ED     Status: Abnormal   Collection Time: 03/27/18 11:20 PM  Result Value Ref Range   Glucose-Capillary 307 (H) 70 - 99 mg/dL  Urinalysis, Routine w reflex microscopic     Status: Abnormal   Collection Time: 03/27/18 11:28 PM    Result Value Ref Range   Color, Urine YELLOW YELLOW   APPearance CLEAR CLEAR   Specific Gravity, Urine 1.015 1.005 - 1.030   pH 5.5 5.0 - 8.0   Glucose, UA >=500 (A) NEGATIVE mg/dL   Hgb urine dipstick NEGATIVE NEGATIVE   Bilirubin Urine NEGATIVE NEGATIVE   Ketones, ur NEGATIVE NEGATIVE mg/dL   Protein, ur 30 (A) NEGATIVE mg/dL   Nitrite NEGATIVE NEGATIVE   Leukocytes, UA NEGATIVE NEGATIVE  Urinalysis, Microscopic (reflex)     Status: Abnormal   Collection Time: 03/27/18 11:28 PM  Result Value Ref Range   RBC / HPF 0-5 0 - 5 RBC/hpf   WBC, UA 0-5 0 - 5 WBC/hpf   Bacteria, UA RARE (A) NONE SEEN   Squamous Epithelial / LPF 0-5 0 - 5  Basic metabolic panel     Status: Abnormal   Collection Time: 03/28/18 12:10 AM  Result Value Ref Range   Sodium 133 (L) 135 - 145 mmol/L   Potassium 3.7 3.5 - 5.1 mmol/L   Chloride 98 98 - 111 mmol/L   CO2 24 22 - 32 mmol/L   Glucose, Bld 302 (H) 70 - 99 mg/dL   BUN 11 6 - 20 mg/dL   Creatinine, Ser 1.61 0.61 - 1.24 mg/dL   Calcium 9.4 8.9 - 09.6 mg/dL   GFR calc non Af Amer >60 >60 mL/min   GFR calc Af Amer >60 >60 mL/min   Anion gap 11 5 - 15  CBC     Status: None   Collection Time: 03/28/18 12:10 AM  Result Value Ref Range   WBC 5.8 4.0 - 10.5 K/uL   RBC 5.60 4.22 - 5.81 MIL/uL   Hemoglobin 15.5 13.0 - 17.0 g/dL   HCT 04.5 40.9 - 81.1 %   MCV 81.1 78.0 - 100.0 fL   MCH 27.7 26.0 - 34.0 pg   MCHC 34.1 30.0 - 36.0 g/dL   RDW 91.4 78.2 - 95.6 %   Platelets 273 150 - 400 K/uL  CBG monitoring, ED     Status: Abnormal   Collection Time: 03/28/18  1:04 AM  Result Value Ref Range   Glucose-Capillary 251 (H) 70 - 99 mg/dL   Imaging Studies: No results found.  ED COURSE and MDM  Nursing notes and initial vitals signs, including pulse oximetry, reviewed.  Vitals:   03/27/18 2321 03/27/18 2322  BP: (!) 173/100   Pulse: 94   Resp: 20   Temp: 97.8 F (36.6 C)   SpO2: 100%   Weight:  (!) 161.2 kg  Height:  5\' 9"  (1.753 m)   We will  restart the patient on his metformin as well as Norvasc for elevated blood pressure.  He states he cannot afford his Lantus.  The importance of establishing with a  primary care physician was emphasized to him.  He was advised that the long-term consequences of untreated diabetes or severe and life-threatening.  PROCEDURES    ED DIAGNOSES     ICD-10-CM   1. Uncontrolled type 2 diabetes mellitus with hyperglycemia (HCC) E11.65   2. Essential hypertension I10        Dakhari Zuver, MD 03/28/18 [id number]

## 2018-03-28 NOTE — ED Notes (Signed)
[facility], f/u, meds, and disease process.

## 2018-03-28 NOTE — ED Notes (Signed)
Alert, NAD, calm, interactive, resps e/u, speaking in clear complete sentences, no dyspnea noted, skin W&D, VSS, BP elevated, c/o shakiness, also mentions urinary frequency and thirst, admits to drinking lots of water, "should be hydrated", (denies: pain, sob, fever, cough, wounds, NVD, dizziness or visual changes).

## 2018-05-31 IMAGING — DX DG KNEE COMPLETE 4+V*R*
4 series · 4 of 4 positions shown · non-contrast
Comparison: None.

CLINICAL DATA: Right knee pain

EXAM:
RIGHT KNEE - COMPLETE 4+ VIEW

[knee ap]
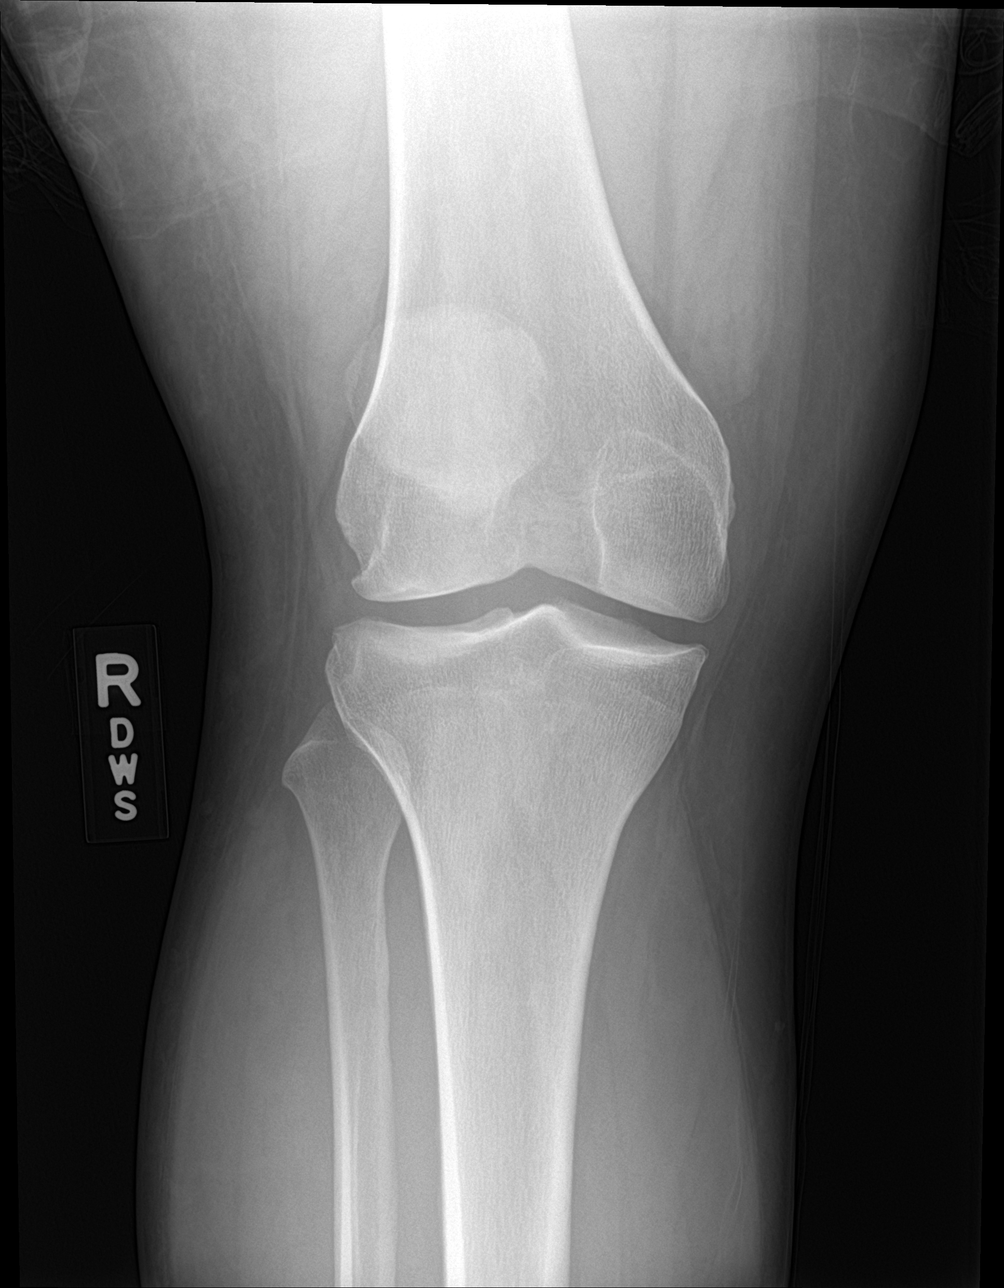

[knee obl (1 of 2)]
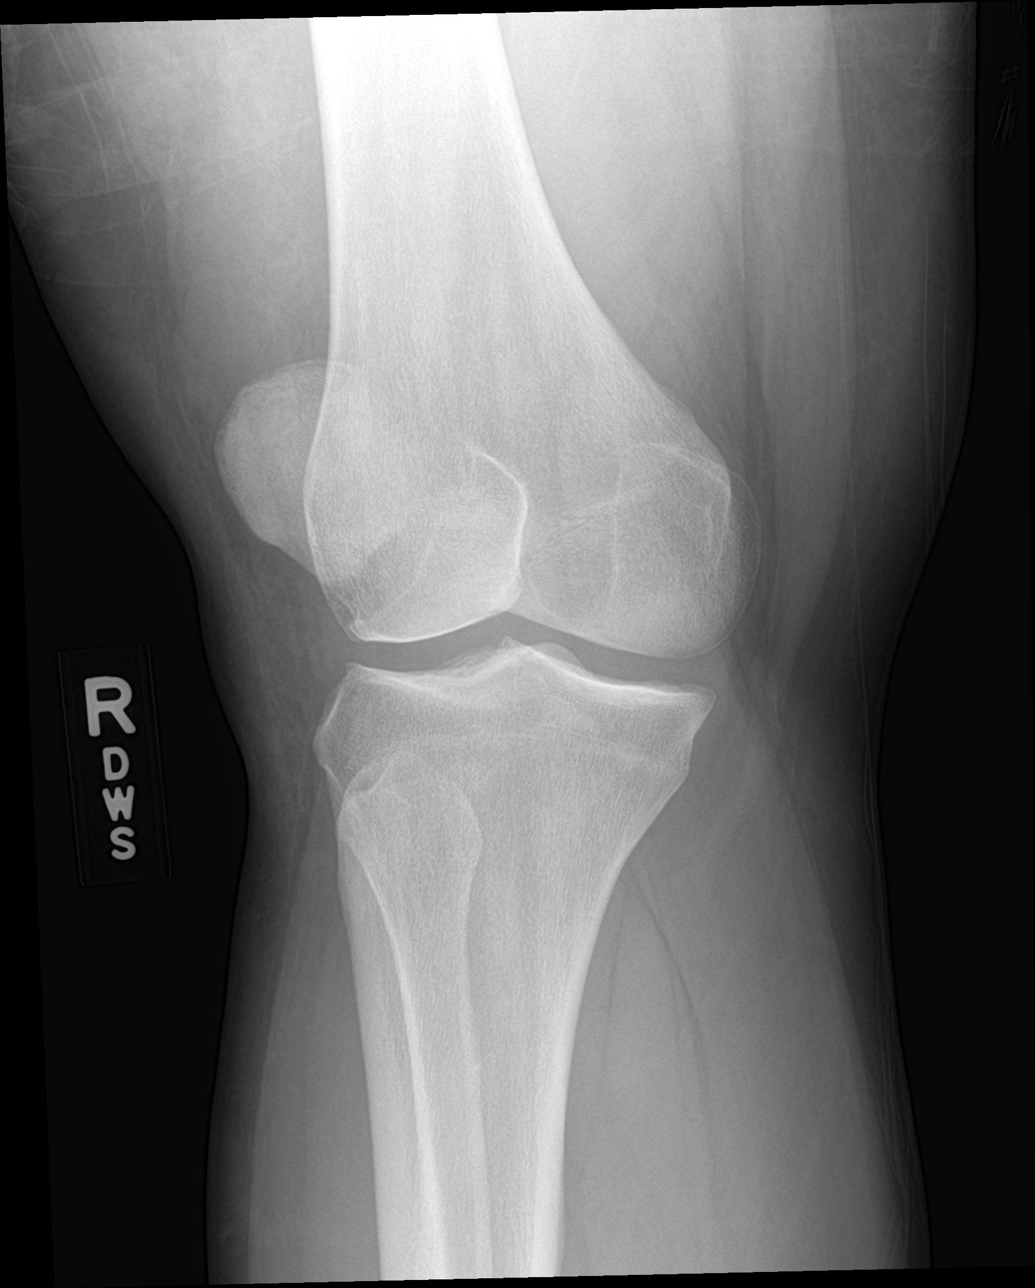

[knee obl (2 of 2)]
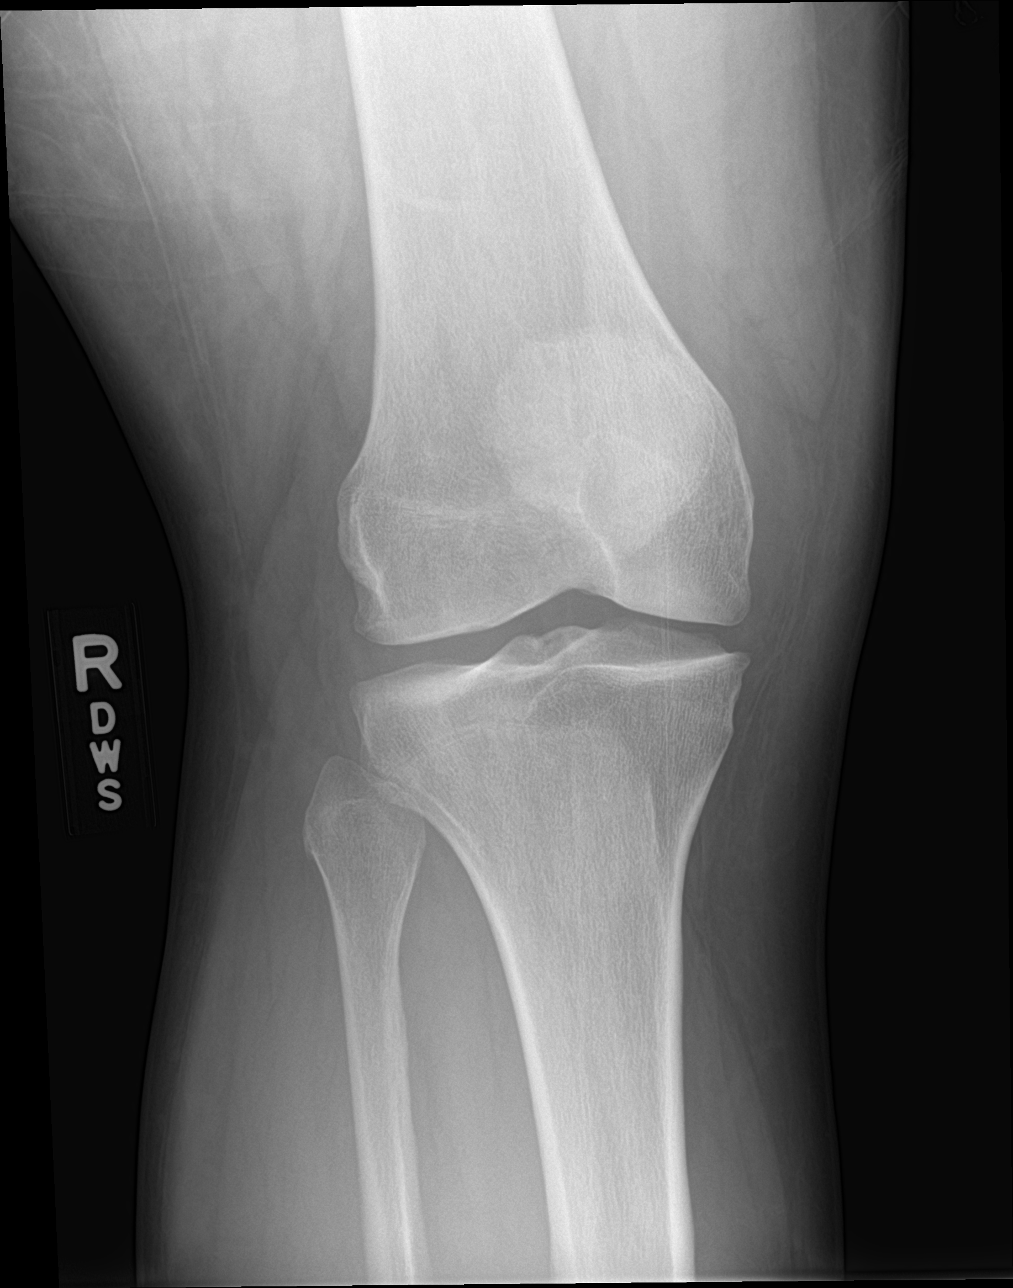

[knee lat]
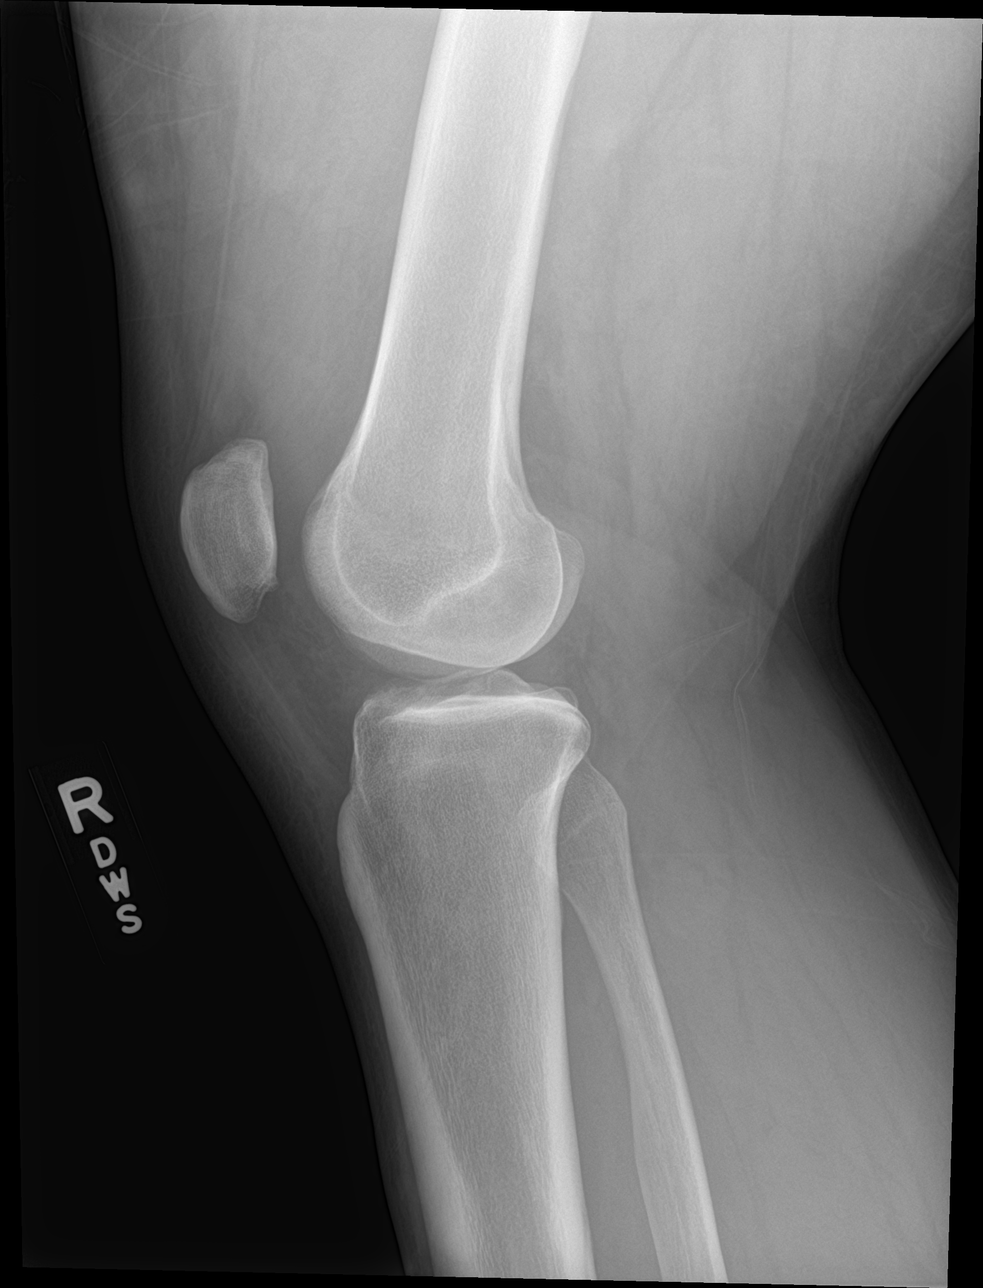

[4 of 4 positions shown; findings below may reference images not displayed]

FINDINGS: No fracture or dislocation is seen.

The joint spaces are preserved.

The visualized soft tissues are unremarkable.

No definite suprapatellar knee joint effusion.
IMPRESSION: Negative.

## 2019-10-19 ENCOUNTER — Ambulatory Visit: Payer: Self-pay | Attending: Internal Medicine

## 2019-10-19 DIAGNOSIS — Z23 Encounter for immunization: Secondary | ICD-10-CM

## 2019-10-19 NOTE — Progress Notes (Signed)
   Covid-19 Vaccination Clinic  Name:  Andrew Richards    MRN: [id number] DOB: [birth year]  10/19/2019  Mr. Andrew Richards was observed post Covid-19 immunization for 15 minutes without incident. He was provided with Vaccine Information Sheet and instruction to access the V-Safe system.   Mr. Andrew Richards was instructed to call 911 with any severe reactions post vaccine: Marland Kitchen Difficulty breathing  . Swelling of face and throat  . A fast heartbeat  . A bad rash all over body  . Dizziness and weakness   Immunizations Administered    Name Date Dose VIS Date Route   Pfizer COVID-19 Vaccine 10/19/2019  9:45 AM 0.3 mL 07/14/2019 Intramuscular   Manufacturer: [employer], [employer]   Lot: [id number]   NDC: [address]-9

## 2019-11-15 ENCOUNTER — Ambulatory Visit: Payer: Self-pay | Attending: Internal Medicine

## 2019-11-15 DIAGNOSIS — Z23 Encounter for immunization: Secondary | ICD-10-CM

## 2019-11-15 NOTE — Progress Notes (Signed)
   Covid-19 Vaccination Clinic  Name:  Andrew Richards    MRN: [id number] DOB: Jun 07, 1991  11/15/2019  [name] was observed post Covid-19 immunization for 15 minutes without incident. He was provided with Vaccine Information Sheet and instruction to access the V-Safe system.   [name] was instructed to call 911 with any severe reactions post vaccine: Marland Kitchen Difficulty breathing  . Swelling of face and throat  . A fast heartbeat  . A bad rash all over body  . Dizziness and weakness   Immunizations Administered    Name Date Dose VIS Date Route   Pfizer COVID-19 Vaccine 11/15/2019  9:33 AM 0.3 mL 07/14/2019 Intramuscular   Manufacturer: [employer], [employer]   Lot: [id number]   NDC: [address]-2
